# Patient Record
Sex: Female | Born: 1970 | Race: Black or African American | Hispanic: No | Marital: Single | Smoking: Former smoker
Health system: Southern US, Community
[De-identification: ages and names within clinical notes are randomized; demographics above are authoritative.]

## PROBLEM LIST (undated history)

## (undated) DIAGNOSIS — F319 Bipolar disorder, unspecified: Secondary | ICD-10-CM

## (undated) DIAGNOSIS — I82409 Acute embolism and thrombosis of unspecified deep veins of unspecified lower extremity: Secondary | ICD-10-CM

## (undated) DIAGNOSIS — E119 Type 2 diabetes mellitus without complications: Secondary | ICD-10-CM

## (undated) HISTORY — PX: LEG SURGERY: SHX1003

---

## 2019-01-05 ENCOUNTER — Other Ambulatory Visit: Payer: Self-pay

## 2019-01-05 ENCOUNTER — Emergency Department (HOSPITAL_BASED_OUTPATIENT_CLINIC_OR_DEPARTMENT_OTHER): Payer: Medicare Other

## 2019-01-05 ENCOUNTER — Emergency Department (HOSPITAL_BASED_OUTPATIENT_CLINIC_OR_DEPARTMENT_OTHER)
Admission: EM | Admit: 2019-01-05 | Discharge: 2019-01-05 | Discharge status: Against Medical Advice | Payer: Medicare Other | Attending: Emergency Medicine | Admitting: Emergency Medicine

## 2019-01-05 ENCOUNTER — Encounter (HOSPITAL_BASED_OUTPATIENT_CLINIC_OR_DEPARTMENT_OTHER): Payer: Self-pay

## 2019-01-05 DIAGNOSIS — Z5329 Procedure and treatment not carried out because of patient's decision for other reasons: Secondary | ICD-10-CM | POA: Diagnosis not present

## 2019-01-05 DIAGNOSIS — Z87891 Personal history of nicotine dependence: Secondary | ICD-10-CM | POA: Diagnosis not present

## 2019-01-05 DIAGNOSIS — M25512 Pain in left shoulder: Secondary | ICD-10-CM | POA: Insufficient documentation

## 2019-01-05 DIAGNOSIS — E119 Type 2 diabetes mellitus without complications: Secondary | ICD-10-CM | POA: Insufficient documentation

## 2019-01-05 DIAGNOSIS — M25511 Pain in right shoulder: Secondary | ICD-10-CM

## 2019-01-05 DIAGNOSIS — M545 Low back pain, unspecified: Secondary | ICD-10-CM

## 2019-01-05 HISTORY — DX: Bipolar disorder, unspecified: F31.9

## 2019-01-05 HISTORY — DX: Acute embolism and thrombosis of unspecified deep veins of unspecified lower extremity: I82.409

## 2019-01-05 HISTORY — DX: Type 2 diabetes mellitus without complications: E11.9
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
## 2019-01-28 ENCOUNTER — Encounter (HOSPITAL_COMMUNITY): Payer: Self-pay | Admitting: *Deleted

## 2019-01-28 ENCOUNTER — Other Ambulatory Visit: Payer: Self-pay

## 2019-01-28 ENCOUNTER — Emergency Department (HOSPITAL_COMMUNITY): Payer: Medicare Other

## 2019-01-28 ENCOUNTER — Emergency Department (HOSPITAL_COMMUNITY)
Admission: EM | Admit: 2019-01-28 | Discharge: 2019-01-28 | Disposition: A | Discharge status: Home / Self Care | Payer: Medicare Other | Attending: Emergency Medicine | Admitting: Emergency Medicine

## 2019-01-28 DIAGNOSIS — M25561 Pain in right knee: Secondary | ICD-10-CM | POA: Insufficient documentation

## 2019-01-28 DIAGNOSIS — Z87891 Personal history of nicotine dependence: Secondary | ICD-10-CM | POA: Insufficient documentation

## 2019-01-28 DIAGNOSIS — Z86718 Personal history of other venous thrombosis and embolism: Secondary | ICD-10-CM | POA: Diagnosis not present

## 2019-01-28 DIAGNOSIS — M79604 Pain in right leg: Secondary | ICD-10-CM

## 2019-01-28 DIAGNOSIS — E119 Type 2 diabetes mellitus without complications: Secondary | ICD-10-CM | POA: Insufficient documentation

## 2019-01-28 MED ORDER — IBUPROFEN 200 MG PO TABS
600.0000 mg | ORAL_TABLET | Freq: Once | ORAL | Status: AC
  Administered 2019-01-28: 600 mg via ORAL
  Filled 2019-01-28: qty 3

## 2019-01-28 NOTE — ED Notes (Signed)
Patient transported to X-ray 
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
## 2019-01-28 NOTE — ED Triage Notes (Signed)
EMS reports pt fell earlier today (trip and fall) coming home from swimming pool hurting rt leg. Since fall she has been walking, reported by EMS. 90/60-70-18-99% CBG 120

## 2019-01-28 NOTE — ED Notes (Signed)
Bed: WA17 Expected date:  Expected time:  Means of arrival:  Comments: 48 yo fall- leg and hip pain

## 2019-07-16 IMAGING — CR RIGHT FOOT COMPLETE - 3+ VIEW
3 series · 3 of 3 positions shown · non-contrast
Comparison: None.

CLINICAL DATA: Fall, pain

EXAM:
RIGHT TIBIA AND FIBULA - 2 VIEW; RIGHT FOOT COMPLETE - 3+ VIEW

[x foot ap right]
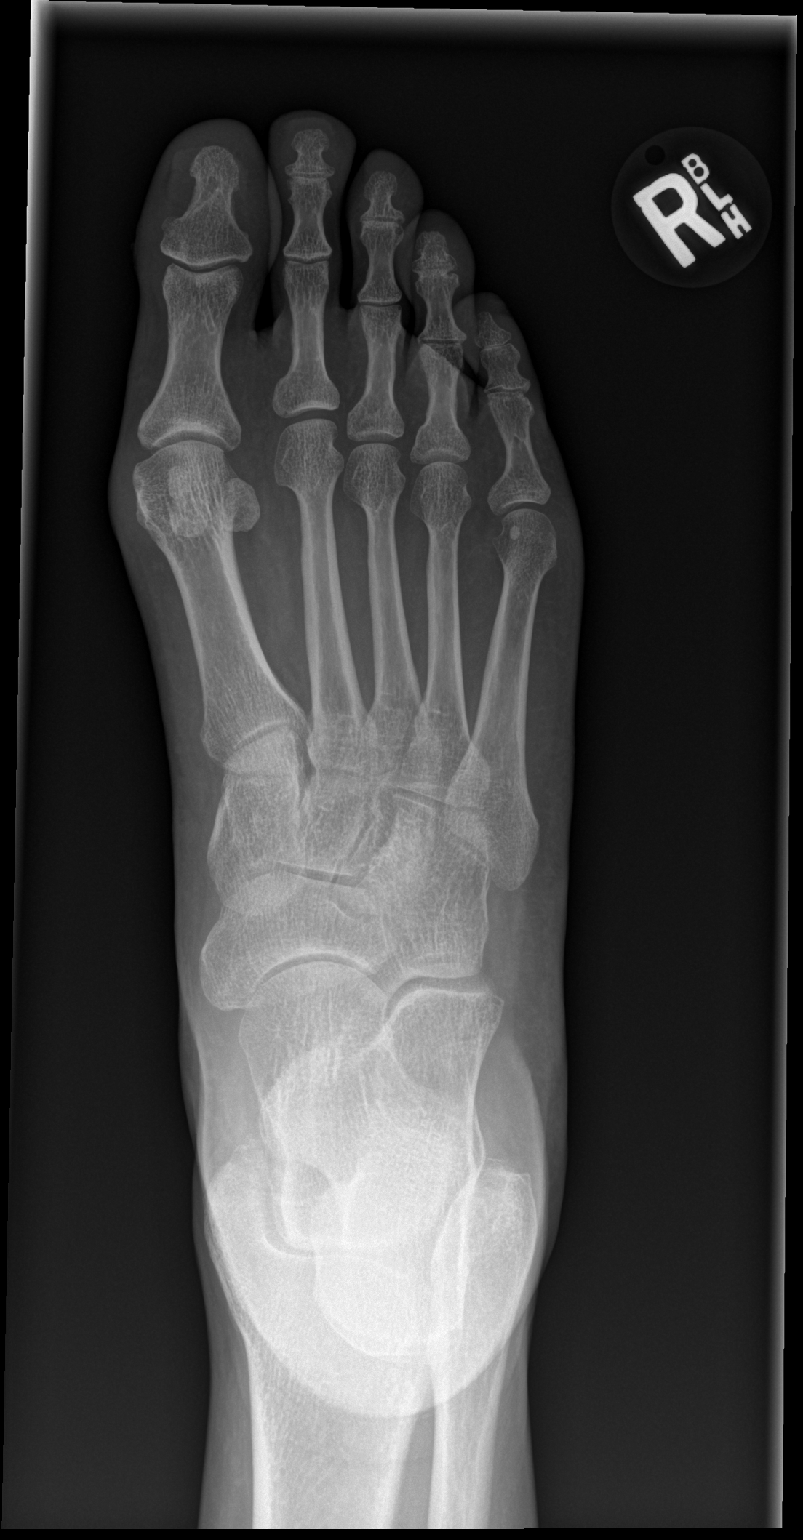

[x foot obl right]
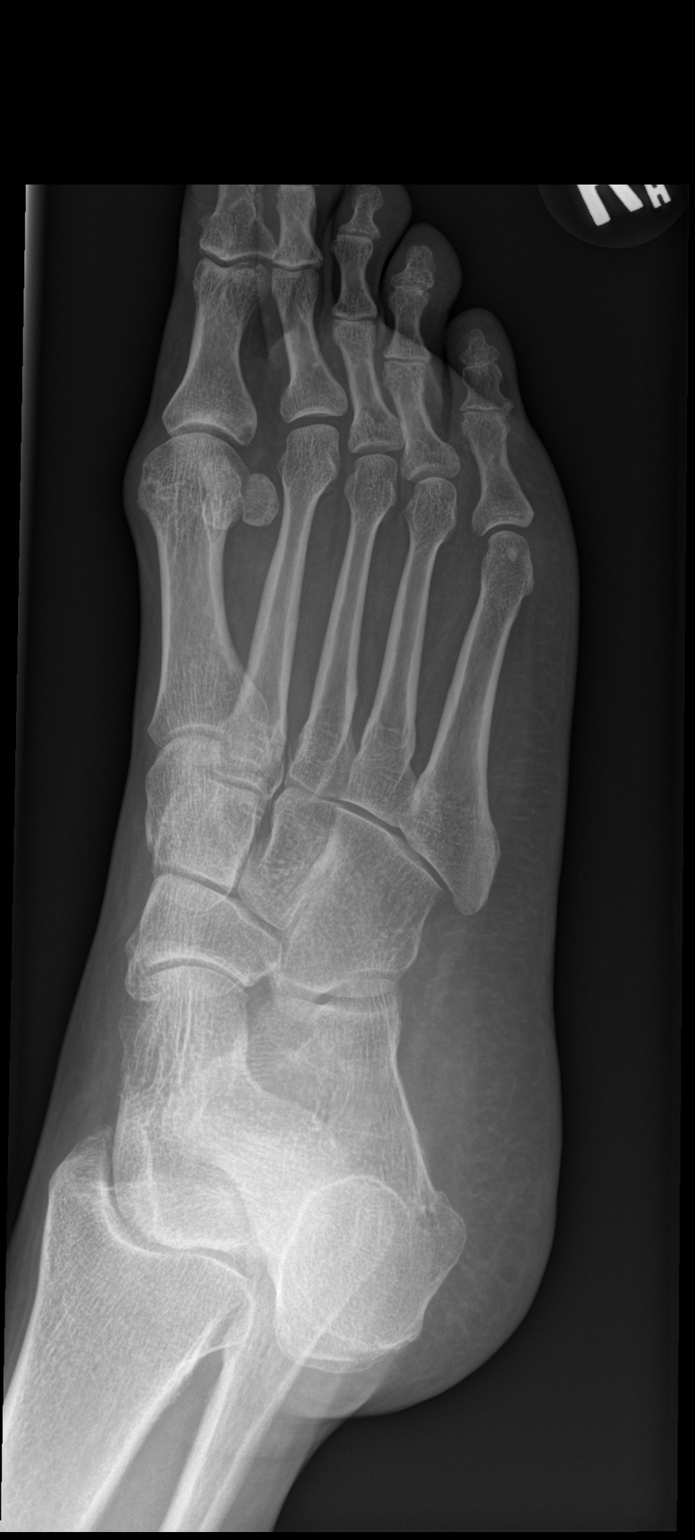

[x foot lat right]
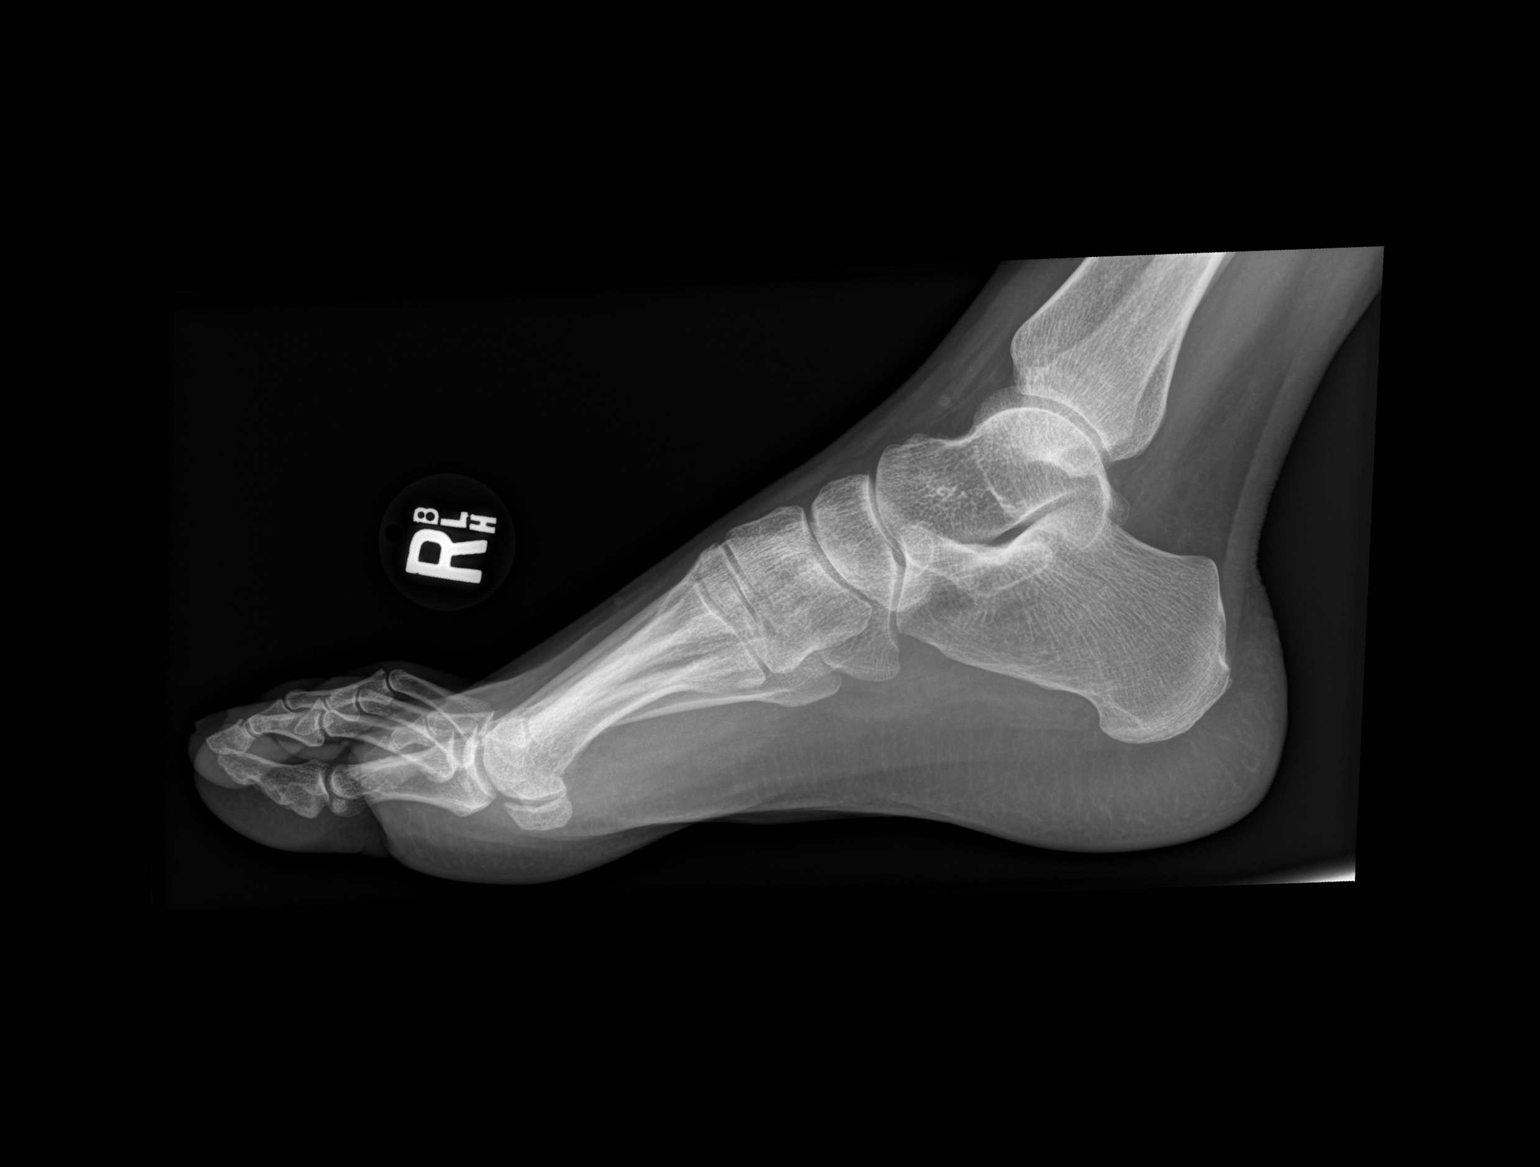

[3 of 3 positions shown; findings below may reference images not displayed]

FINDINGS: There is no evidence of fracture or other focal bone lesions. Soft
tissues are unremarkable.
IMPRESSION: No fracture or dislocation of the right foot or right tibia or
fibula.

## 2019-07-16 IMAGING — CR RIGHT TIBIA AND FIBULA - 2 VIEW
3 series · 3 of 3 positions shown · non-contrast
Comparison: None.

CLINICAL DATA: Fall, pain

EXAM:
RIGHT TIBIA AND FIBULA - 2 VIEW; RIGHT FOOT COMPLETE - 3+ VIEW

[x tib-fib ap right]
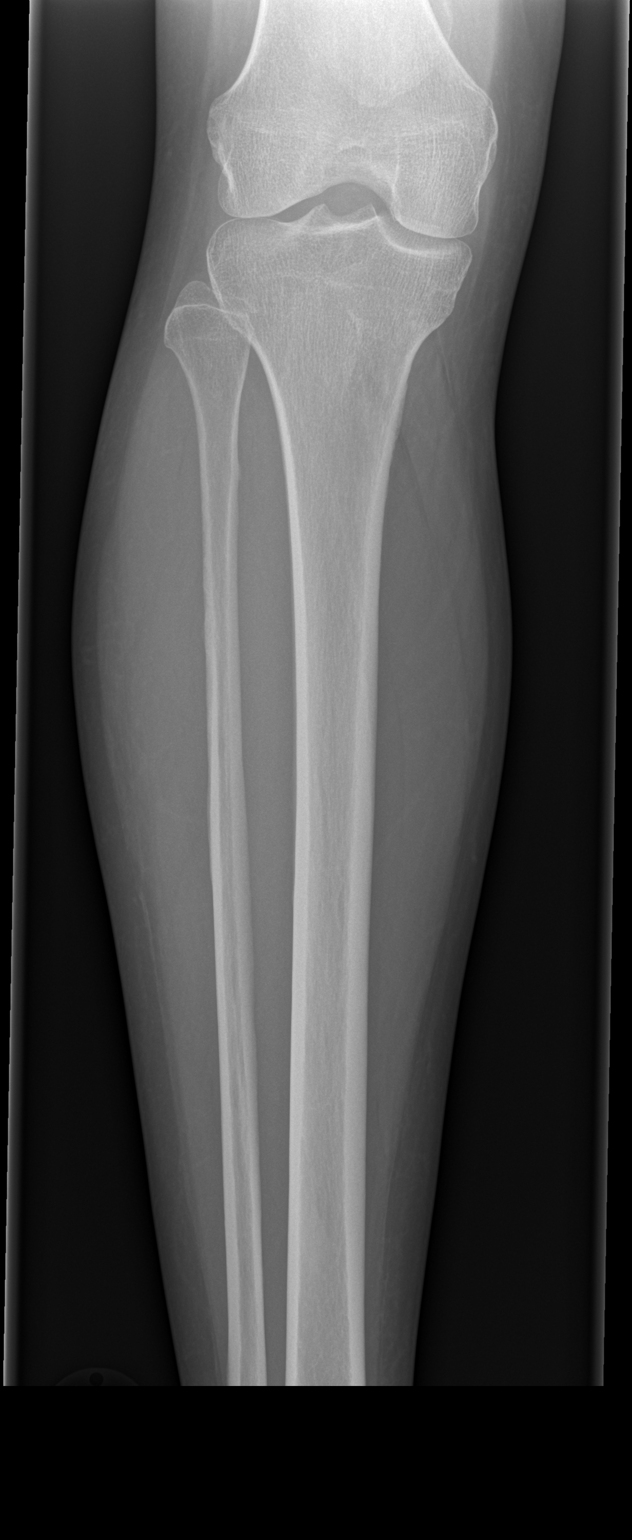

[x tib-fib lat right (1 of 2)]
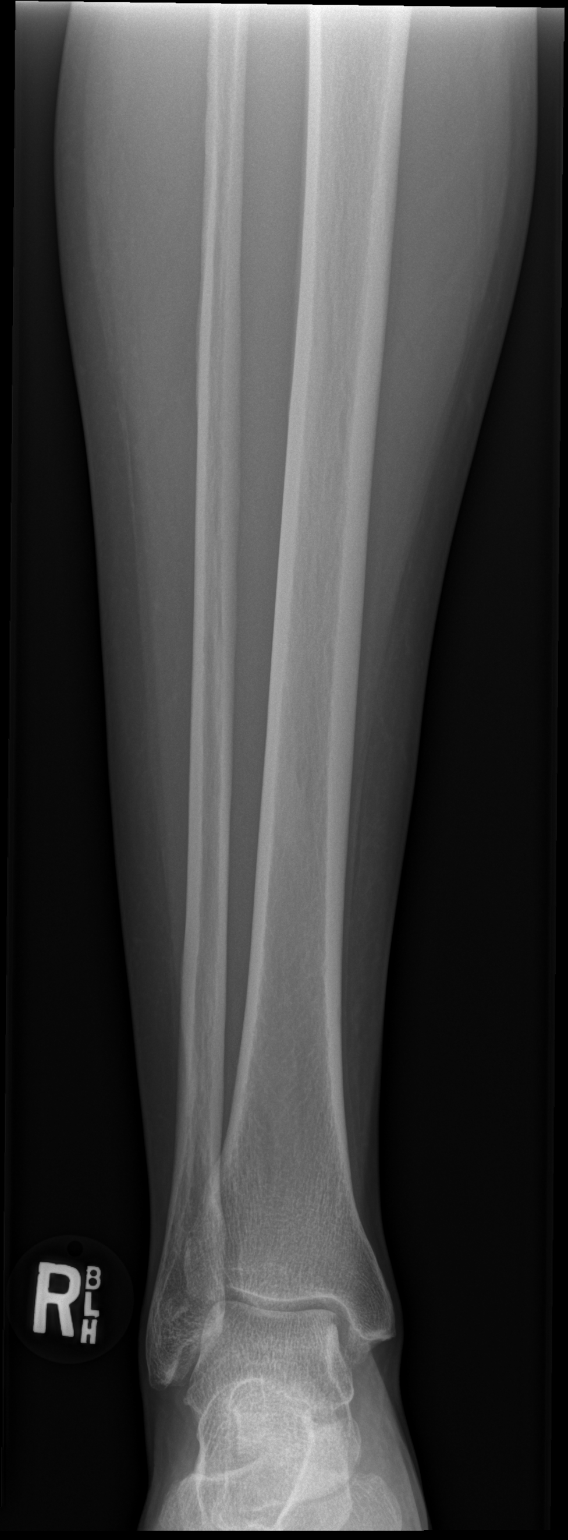

[x tib-fib lat right (2 of 2)]
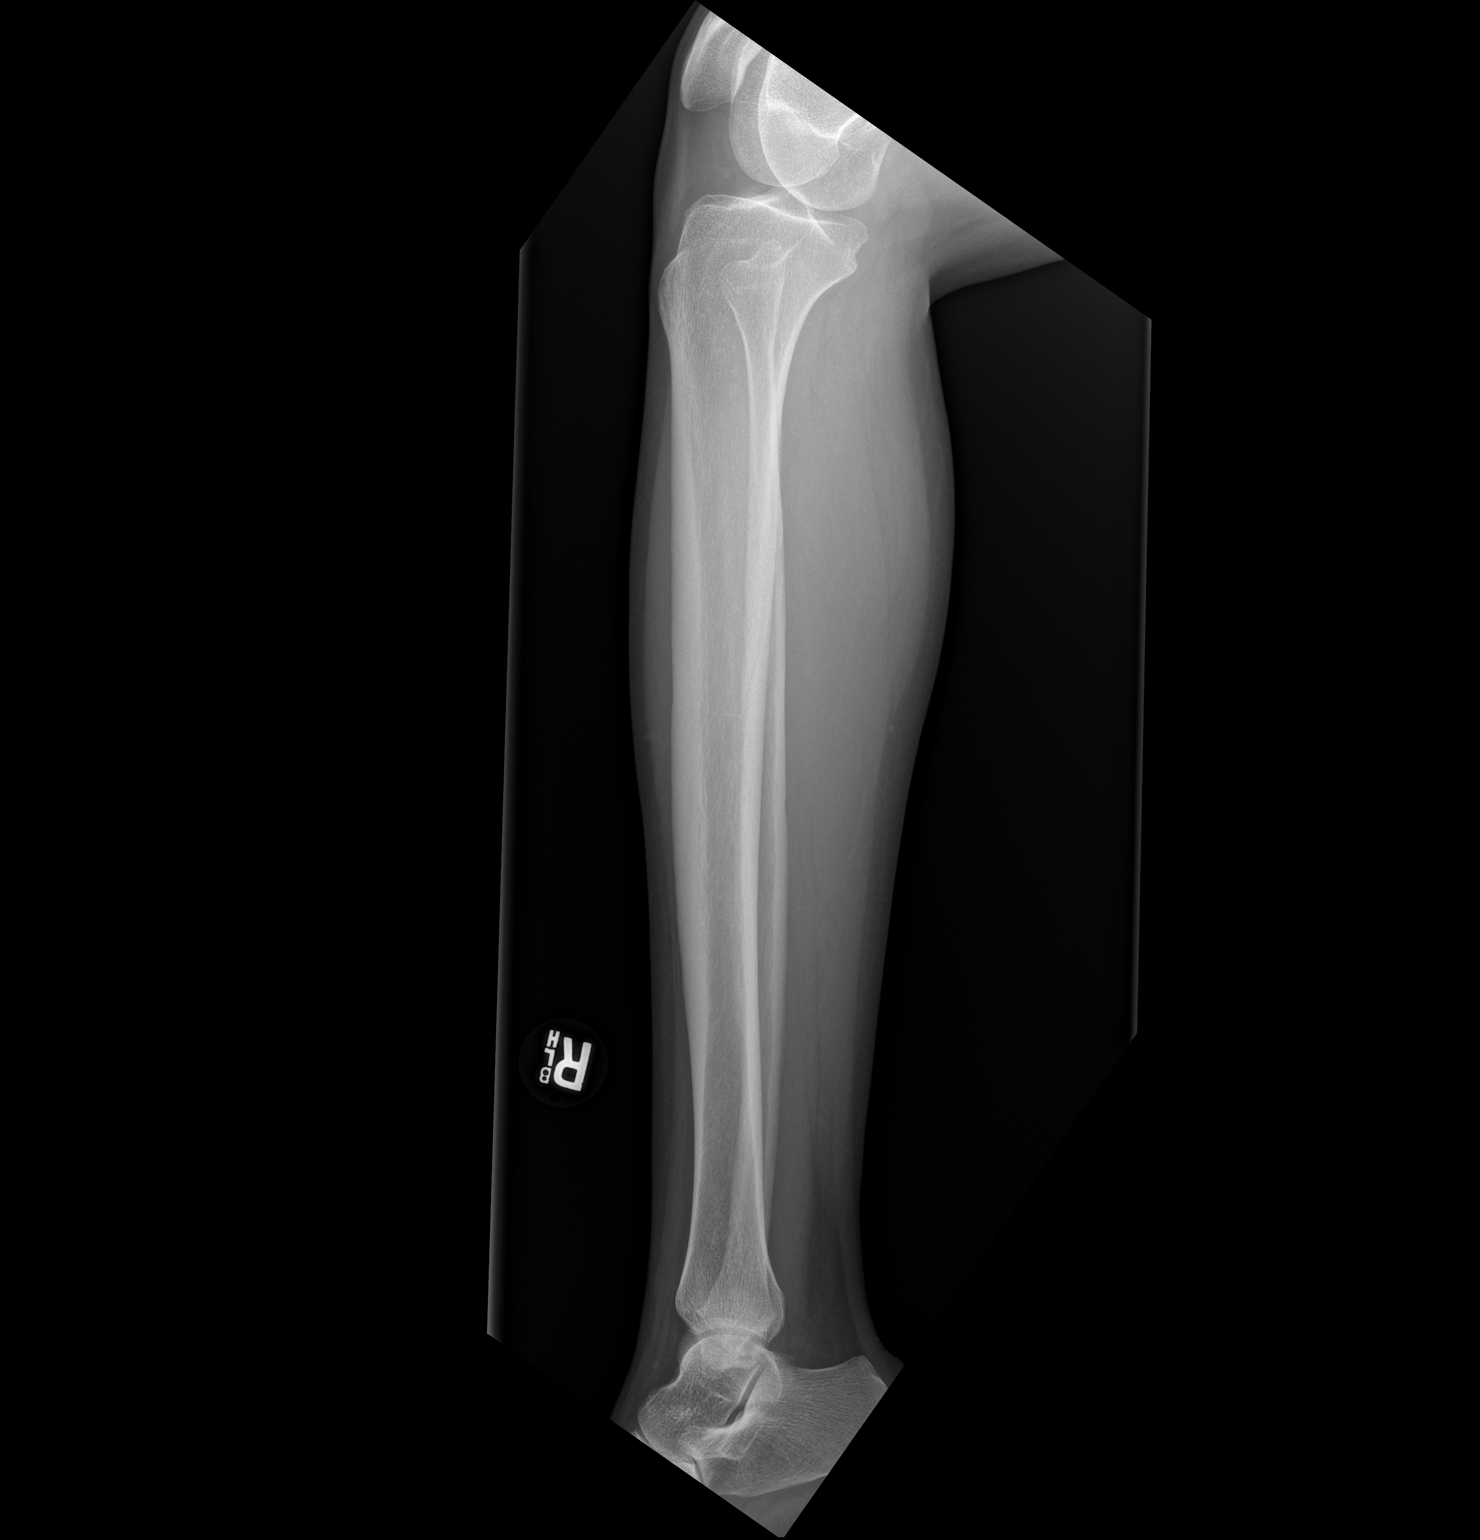

[3 of 3 positions shown; findings below may reference images not displayed]

FINDINGS: There is no evidence of fracture or other focal bone lesions. Soft
tissues are unremarkable.
IMPRESSION: No fracture or dislocation of the right foot or right tibia or
fibula.

## 2021-08-27 NOTE — Progress Notes (Signed)
Formatting of this note might be different from the original.  Left message to schedule AWV  Electronically signed by Deeann Dowse, MA at 08/27/2021  2:41 PM EST

## 2021-11-21 NOTE — Progress Notes (Signed)
Formatting of this note is different from the original.     11/21/21 1319   Therapeutic Recreation   Source of Information Patient;Chart   Reason for Hospitalization per the Patient. Pt stated "Suicidal."   Patient's ability to answer questions Independently;with prompts, assistance   Patient admitted from Support Person's Home   Care Facility Name Pt reported she is living with her aunt.   Transportation Dependent on Others   Current City/County Living In [place]   Work/School Disabled   Highest Level Education High School Graduate   Physical   Is the patient able to complete ADL's and household tasks independently? Yes   Vision - Right Eye Glasses - to see close up;Glasses - to see distances   Vision - Left  Eye Glasses - to see close up;Glasses - to see distances   Hearing - Right Ear Functional   Hearing - Left Ear Functional   Additional Equipment/Devices used at home None   Is Pain an ongoing issue for the patient? Yes   Is the Patient in pain now?   (N/A)   Pain Score   (N/A)   Pain Location   (N/A)   Multiple Pain Sites   (N/A)   Social Leisure   Does the patient have a positive support system? Yes   Who is the Support? "My dad."   How many times does the patient get out of the house a week? "Not a lot."   Does the patient have any knowledge of community resources? Yes   Resources Support Groups;Library;Parks and Rec;[employer];Church   The Patient is Comfortable in: Alone;Small Groups   Has the patient's daily routine changed since not feeling well? No   Does the patient have any cultural beliefs/activites that would impact care? No   How does the patient spend Cognitive leisure / free time? Reading   How does the patient spend Passive leisure / free time? TV   How does the patient spend Active leisure / free time? Walking   How does the patient spend Creative leisure / free time? Crafts   How does the patient spend Social leisure / free time? Friend/family gatherings   Does the patient prefer to spend  leisure/free time Both   Has the patient been active in leisure activities No   If no, how long has it been? "A long time."   Barriers to participating in leisure activities? Finances;Motivation;Physical limitations;Pain;Transportation;Companionship   Describe Barriers: Pt identified lack of finances, low motivation, physical limitations, chronic pain, lack of transportation, and lack of companionship as barriers.   Does the patient have any coping skills? Yes   Healthy coping skills Talk to people   Unhealthy coping skills Using drugs   Cognitive   Does the patient have issues  or difficulty with memory? No   Orientation To: Person (Yes);Place (Yes);Situation (Yes);Date (No)   Hallucinations? No   Delusions? No   Paranoia Evidenced by: N/A   Does the patient have difficulty with time management skills? Yes   Does the patient have difficulty with their ability to concentrate or focus? Yes   Does the patient have difficulty with problem solving skills? Yes   Emotional   Is there concern about patient?s self esteem? Yes   Describe Self Esteem Concern: Pt described self-esteem as "bad".   Is there concern about patient?s motivation/energy level? Yes   Describe Motivation/Energy Level  Concern: Pt described both motivation and energy as "down".   Does the patient have life stresses? Yes  Life Stresses Identified Other:  (Father's health; Unresolved grief of late daughter; Unstable housing)   Is there concern about patient?s anger management? No   Is the patient comfortable talking about his/her feelings and concerns? Yes   History of Abuse? No   Describe History of Abuse N/A      11/21/21 1325   Substance Abuse Questions   Does the patient abuse Drugs and Alcohol? Yes   At admission  BAL UDS (per Chart) UDS  (BAL: Normal; UDS: Fentanyl)   Is there concern that drinking/using is affecting the patient?s health? Yes   Describe the Concern "It could kill me."   Has the patient ever tried to stop drinking/using before? Yes    How long was patient clean/sober? "A few years."   What helped? "I can't remember."   What caused Relapse? "I can't remember."   Has drinking/using ever created problems for the patient: Yes   Between patient and family? Yes   Between patient and friends? Yes   At work/School? No   Explain drinking problem Pt reported she snorts approximately $10 worth of cocaine over 2-3 days and snorts approximately $10 worth of fentanyl over 2 days.   Does the patient have any leisure interests that can be enjoyed clean/sober? Yes   Leisure interests enjoyed. "Watching tv."   Does the patient have anyone they enjoy spending time with clean/sober? Yes   Who does patient enjoy spending time with? "My daddy, my aunt."   Age patient began drinking? N/A   Age patient began Using Drugs? Pt was unable to recall.   Patient motivation to begin recovery (Per Patient) (1-10) 10   Has the patient ever attended support group meetings? No      11/21/21 1327   Patient Perspective   What else should we know about you in order to help you best while you are here? "I don't know."   How do you feel staff can help you most? "Get me on my right medicines."   What would you like to change about your daily routine? "Just stay on my medicines."   Therapist Summary   Patient?s behaviors impacting Therapy: Other  (Withdrawal sx.)   Patient?s Strengths: Physical abilities;Cognitive abilities;Support system;Community resources   Patient?s Weaknesses: Communication;Poor judgement;Self esteem/image;Coping skills;Transportation;Motivation;Leisure involvement;Leisure skills   Suggested Interventions: Exercise;Relaxation techniques;Social skills development;Leisure skill development;Leisure education;Community awareness;Pet Therapy;Cognitive stimulation;Music;Self awareness   Patient Education Anger Management;Stress management;Self esteem;Coping skills;Time management;Wellness education;Grief and Loss;Goal setting   Goals for RT treatment during this  hospitalization: LTG 1.0: Pt will attend 1 scheduled recreational therapy group daily upon admission to promote knowledge of healthy coping skills.   Rec Therapist Comments: Pt presented as orientedx3, as she was unable to identify the correct date. Pt presented as lethargic, as she was resting on her bed while having difficulty staying awake while answering questions. Pt displayed a depressed mood and tearful affect. Pt's thoughts appeared clear while speech was limited yet relevant in content while quiet in volume and sad in tone. When asked reason for admission, pt stated "Suicidal." Per chart, pt presented to the ED d/t body aches. During evaluation with ED provider, pt endorsed suicidal ideations. At the time of this assessment, pt continued to endorse suicidal ideations but denied having a plan. Pt denied homicidal ideations, hallucinations, delusions, and paranoia at any time. Pt denied ETOH use but endorsed drug use. Pt's BAL was normal while UDS was positive for cocaine and fentanyl. Pt reported she snorts approximately $10 worth  of cocaine over 2-3 days and snorts approximately $10 worth of fentanyl over 2 days. Pt expressed concerns with usage and rated her motivation to stop using to be 10/10 at this time. Pt reported stress related to her father's health, chronic pain, current living situation, and unresolved grief of late daughter. Pt was able to identify few healthy leisure activities of interest and only with heavy probing. Pt reported minimal involvement in leisure activities for "a long time" d/t lack of finances, low motivation, physical limitations, chronic pain, lack of transportation, and lack of companionship.   Lessie Dings, LRT CTRS  11/21/2021 / 1:44 PM      Electronically signed by Lessie Dings, LRT CTRS at 11/21/2021  1:44 PM EST

## 2021-11-21 NOTE — Care Plan (Signed)
Formatting of this note might be different from the original.    Problem: Coping - Ineffective, Patient/Family  Goal: Effective coping  Outcome: Progressing    Problem: Discharge Planning  Goal: Knowledge of treatment plan (Why is it important for me to do this?)  Outcome: Progressing  Goal: Knowledge of medication management  Outcome: Progressing    Problem: Pain - Acute  Goal: Reduced pain sensation  Outcome: Progressing    Problem: Physical Injury, Risk of - to Self or Others  Goal: Absence of self-harm  Outcome: Progressing  Goal: Decrease in suicidal ideations  Outcome: Progressing    Patient has been calm and cooperative thus far, however, has been isolative to her room and presents as withdrawn, currently denies SI/HI/AVH but endorses overwhelming depressive symptoms, no other questions or concerns discussed during shift  Electronically signed by Sabino Donovan, RN, BSN at 11/21/2021 10:21 PM EST

## 2021-11-21 NOTE — Nursing Note (Signed)
Formatting of this note might be different from the original.  Pt up to unit at 1318. Contraband search and skin search assisted by Lora Havens, MHT. No contraband. No current injuries. A healed laceration to R leg calf that is not draining, pt states it is years old that is just how it looks. States using fentanyl "a small amount" on the street. States no other drug or nicotine use. States is here for SI. States having fleeting thoughts of SI but that if pt comes up with a plan or suicide intent would let staff know. Requested something to eat which was provided by psych tech.   Electronically signed by Hillery Jacks, RN at 11/21/2021  1:24 PM EST

## 2021-11-21 NOTE — Progress Notes (Signed)
Formatting of this note is different from the original.     11/21/21 1430   BH DID NOT ATTEND GROUP   Attendance Status Did Not Attend   Did not Attend Group Type Rec Therapy-Wellness   Reason for Not Attending Group Patient Refused  (Pt was found laying in bed at the time of the group invitation. Pt stated "okay", when encouraged to attend group session. Pt did not present to group.)   Follow-up for Not Attending Group Therapy staff will continue to encourage pt to attend group sessions.     Kerry Dory, LRT CTRS  11/21/2021 / 3:27 PM      Electronically signed by Kerry Dory, LRT CTRS at 11/21/2021  3:27 PM EST

## 2021-11-22 NOTE — H&P (Signed)
Formatting of this note is different from the original.  Images from the original note were not included.  [facility]  [facility] - H&P      Date of Admission: 11/21/2021  Attending Provider:  Burton Apley, MD  Code Status:  Full Code  History Source:  patient, review of medical records  Record Review: moderate  Assessment     Hospital Diagnoses:  Principal Problem:    Bipolar 1 disorder (*)  Active Problems:    Type 2 diabetes mellitus, without long-term current use of insulin (*)    Moderate cocaine use disorder (*)    PTSD (post-traumatic stress disorder)    Persistent complex bereavement disorder    Reason(s) for Admission: suicidal ideation and unable to contract for safety outside of hospital and patient manifests major disability in social, interpersonal, occupational and/or educations functioning which threatens survival and can only be addressed in a hospital setting    The patient does not have a legal guardian or HCPOA.    Formulation and MDM: *a 51 year old African American female with a history of schizophrenia, bipolar disorder, depression, and polysubstance use transferred from [facility] ED for SI in-context of Prior trauma    Treatment Plan     - Precautions:   - SI, Aggression and/ or Withdrawal  - Commitment status:  Voluntary  - Goals and Interventions:   - Group and milieu therapy with family/outpatient support meeting as tolerated. Treatment plan focuses on modifiable risk factors and includes: level of care coordination and recommendations, risks/benefits/alternatives to treatment including common side effects and black box warnings on medication, risk factor reduction, safety goals/plan, importance of compliance with chosen treatment and firearms access reduction plan.              - Other issues to be addressed:  safety planning, housing, aftercare, addiction, medication compliance, ability to attend scheduled appointments, titration of medication with  maximized efficacy, consideration of adjunct medications and/or ECT.                - Treatment goals will address insight and acceptance of mental illness, coping skills, communication skills, mood, anxiety, sleep and appetite, suicidal or homicidal ideation, psychosis, self-injurious behaviors, aggression, improving independence, improving social relationships, domestic violence, and other issues as appropriate.    - Medications:  ? divalproex sodium  250 mg Oral Daily   ? FLUoxetine  20 mg Oral Daily   ? insulin lispro protamine-insulin lispro  4 Units Subcutaneous BID MEALS   ? metFORMIN  500 mg Oral Daily with breakfast   ? nitrofurantoin (macrocrystal-monohydrate)  100 mg Oral BID MEALS   ? THERA  1 tablet Oral Daily     - Pertinent Labs:              - Reviewed.  All labs obtained and reviewed from the current EMR, unless otherwise stated.              - Please see labs as reported below  - Consults:   - none  - Estimated duration of hospitalization:   -  5 to 7 days   -  I certify that the patient does need, on a daily basis, active treatment furnished directly by or requiring the supervision of inpatient psychiatric facility personnel.  For inpatient care, physician will direct treatment team plan within three days of admission and at least weekly thereafter.     CC & HPI  CC: "I was not thinking right".     During the assessment, the pt reported that she was unable to control her anxiety, feeling overwhelmed, no one is around her, thinking about her past - her mother and daughter who died 4 yrs ago, her father has open heart surgery recently, unable to secure any job, not eating well, lost 6 lbs in last month and thinking about walking to the traffic. Pt has been hearing voice of a gun shot from how her daughter was died, unable to sleep at night. Denied any AVH or perceptional disturbances.     Per ED note,   the patient lying in bed in ED in darkened room with eyes closed in NAD. Patient arouses when  name called. Patient is oriented x4 when awake. Patient has difficulty staying awake during assessment and has to be woke up several times to answer questions. Patient is cooperative and answers questions appropriately. Patient endorse SI with a plan to get a gun and shoot herself. Patient denies having a gun stating it is now at her cousin's house. Patient also endorses AVH, but is unable to describe what she sees or hears with hallucinations, but states they have increased in frequency over the last couple of weeks.     Per [facility] access note, "Pt is a 51 year old African American female with a history of schizophrenia, bipolar disorder, depression, and polysubstance use who presents to [facility] ED via personal transport due to experiencing body aches. During re-eval with ED provider, pt shared feeling suicidal with a plan to walk out into traffic, so a [facility] consult was placed. Upon arrival to complete assessment, pt is NAD, lying on back resting. Pt was easily awaken by voice prompt and was oriented times four. Pt presents as tearful and depressed with a blunted affect. Pt shares suicidal thoughts started shortly after feeling aches and pains in body. Pt became tearful as she described having intermittent suicidal thoughts since the death of her daughter, which occurred 4 years ago. Pt shares experiencing loss of pleasure, social isolation, and decrease sleep in the past 2 years. Pt endorses previous suicidal attempt of driving scooter into traffic. EMR review shares a previous attempt of intentionally overdosing on Xanax. Pt denies HI/SIB and psychosis. Pt endorses using cocaine (powder) and fentanyl, route is snorting. Pt shares last use was last week but UDS is positive for both cocaine and fentanyl. Pt shares "I use every now and then, I don't do much" Pt shares on average spending $20 a piece on both cocaine and fentanyl. Pt shares being prescribe anti psychotic medications by Dr. Sharmon Revere but pt is a  poor historian and does not know the names of medications that were prescribed by provider. Pt   endorses being medication compliant. Pt has had numerous [facility] hospitalizations between [facility] and [facility]. Pt's sister Marcelino Duster [phone] contacted for collateral information. Per Ms. Bitting, "I haven't seen her in a month..... then she was doing a lot of drugs and being suicidal..... she has been like this since our mom died.... it got worse after her daughter died". Ms. Renato Gails shares "she needs a facility far away from here to get away from those friends of hers.... she's burned bridges.... she's hopping from house to house". Ms. Renato Gails describes pt's baseline as "quiet, humble, fun to be around, and sweet as gold".    Current suicidal/homicidal ideations:  Suicidal with plan and Cannot contract for safety outside of hospital  Current auditory/visual hallucinations:  Denies hallucinations and does not appear to be responding to internal stimuli  Current delusions: Denies  Current paranoia:  Denies  Current withdrawal symptoms necessitating inpatient detox:  COWS score: 3  Sleep:  difficulty falling asleep and difficulty maintaining sleep  Appetite: Restricting  Hygiene:  clean  History of falls in the last 12 months:  No  History of head injury:  No    Physical Exam     Physical exam:   HEENT:  WNL, PEERLA, EOMs intact, TMs clear, no lymphadenopathy  Heart:  RRR, no MRG  Lungs: CTA  Abd:  Soft, nontender, nondistended  Neuro:  2+ pulses, 2+ reflexes, no sensory loss    Past Psychiatric History   All pertinent past Psychiatric history reviewed and updated as per requirement.     Previous diagnoses: Bipolar 1, schizophrenia, Depression  Previous psychiatric medication trials: Abilify, Cymbalta, Wellbutrin, Lamictal, Seroquel, Zoloft, Paxil, Haldol, Xanax, Valium, Depakote  Past suicidal/homicidal ideation/attempt: Overdose on Xanax 2 years ago, per EHR  Current/Past psychiatric provider: Dr  Chesley Mires  Previous psychiatric hospitalizations/Rehab: numerous BH hospitalizations at [facility] and [facility]    Past Medical History   All past Medical history reviewed and updated as per requirement.     Past Medical History:   Diagnosis Date   ? Anxiety    ? Asthma    ? Bipolar 1 disorder (*)    ? Depression    ? Diabetes mellitus (*)    ? Diabetes mellitus, type 2 (*)    ? DVT (deep venous thrombosis) (*)     right leg   ? Failure of the liver caused by chronic liver disease (*) 03/13/2013   ? Hepatitis C infection    ? 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 Liver failure (*)     hx of   ? Neuropathy     pt denies Diabetes   ? Protein C deficiency (*)    ? Schizoaffective disorder (*) 08/10/2019     Substance Use History (Over the past 12 months)   Marijuana: Denies  Cocaine: Endorses last use one week ago, however (UDS +)  Opiates: Endorses fentanyl use (UDS +)  Stimulants: Denies  Benzodiazepine: Denies  Tobacco:  Denies  Alcohol: Denies  Other illicit drug usage: Denies      The patient was counseled on the dangers of alcohol/substance use and practical counseling included: Concern for unhealthy drinking, alcohol linked to health risk such as HTN, DM, hepatic failure, and/or stroke, and advised to abstain    See [facility] staff evaluations & assessments for further details.  Findings to be discussed by team and integrated into treatment plan as indicated.  Tobacco Use Screening and Recommendation     Tobacco use 30 days prior to admission?  Patient denied    The patient was not counseled on the dangers of tobacco use and practical counseling included: N/A.  Reviewed strategies to maximize success, including stress management and support of family/friends.    FDA-approved cessation medication offered/received: N/A    Social and Family History   All pertinent Social and family history was reviewed and updated as requirement.     Social History     Socioeconomic History   ? Marital status:  Single     Spouse name: Not on file   ? Number of children: Not on file   ? Years of education: Not on file   ? Highest education level:  Not on file   Occupational History   ? Not on file   Tobacco Use   ? Smoking status: Some Days     Packs/day: 0.50     Types: Cigarettes     Last attempt to quit: 05/08/2019     Years since quitting: 2.5   ? Smokeless tobacco: Former   Advertising account planner   ? Vaping Use: Some days   Substance and Sexual Activity   ? Alcohol use: No     Alcohol/week: 0.0 standard drinks   ? Drug use: Not Currently     Frequency: 1.0 times per week     Types: Marijuana, "Crack" cocaine     Comment: last use 7 months ago per pt   ? Sexual activity: Not Currently   Other Topics Concern   ? Not on file   Social History Narrative   ? Not on file     Social Determinants of Health     Financial Resource Strain: Medium Risk   ? Difficulty of Paying Living Expenses: Somewhat hard   Food Insecurity: Not on file   Transportation Needs: No Transportation Needs   ? Lack of Transportation (Medical): No   ? Lack of Transportation (Non-Medical): No   Physical Activity: Not on file   Stress: Stress Concern Present   ? Feeling of Stress : Very much   Social Connections: Not on file   Intimate Partner Violence: Not on file   Housing Stability: Not on file     Family History   Problem Relation Age of Onset   ? Diabetes Mother    ? Arthritis Mother    ? Depression Mother    ? Vision loss Mother    ? Heart disease Father    ? Arthritis Father    ? Depression Father    ? Hyperlipidemia Father    ? Hypertension Father    ? Cancer Sister    ? Post-traumatic stress disorder Maternal Uncle      Evaluation     Medications:  ? divalproex sodium  250 mg Oral Daily   ? FLUoxetine  20 mg Oral Daily   ? insulin lispro protamine-insulin lispro  4 Units Subcutaneous BID MEALS   ? metFORMIN  500 mg Oral Daily with breakfast   ? nitrofurantoin (macrocrystal-monohydrate)  100 mg Oral BID MEALS   ? THERA  1 tablet Oral Daily     aluminum &  magnesium hydroxide-simethicone, diphenhydrAMINE **OR** diphenhydrAMINE, ibuprofen, LORAzepam, OLANZapine zydis **OR** OLANZapine, ondansetron **OR** ondansetron, polyethylene glycol, traZODone **AND** traZODone  Allergies:  Allergies   Allergen Reactions   ? Norflex Itching   ? Tramadol Nausea And Vomiting, Swelling and Rash     Tongue swelling in 2009.  Tongue swelling in 2009.  Other reaction(s): Swelling (ALLERGY/intolerance)  Tongue swelling in 2009.  Tongue swelling   ? Abilify Other     Dystonic reaction   ? Acetaminophen Other and Rash     Pt states he takes this OTC  Liver failure   ? Cymbalta [Duloxetine Hcl] Nausea And Vomiting, Rash and Dermatitis   ? Dabigatran Etexilate Mesylate Unknown   ? Haloperidol Other     stiffness   ? Lyrica Unknown     Suicidal thoughts   ? Paroxetine Hcl Dermatitis   ? Pregabalin Other     Suicidal thoughts   ? Quetiapine Fumarate Rash   ? Sertraline Rash     In mouth    ? Toradol [Ketorolac Tromethamine] Hives   ?  Vicodin [Hydrocodone-Acetaminophen] Rash   ? Wellbutrin [Bupropion] Nausea And Vomiting     Other reaction(s): Vomiting (intolerance)   ? Oxycodone-Acetaminophen Nausea Only     Pt reports she is not allergic to Oxy IR just the tylenol    ? Tylox Nausea Only     Vitals:   Vitals:    11/22/21 0550   BP: 116/70   Pulse: 64   Resp: 18   Temp: 97.9 F (36.6 C)   SpO2: 100%       Labs were reviewed and included:  Lab Results   Component Value Date    WBC 7.9 11/21/2021    HGB 9.0 (L) 11/21/2021    HCT 29.6 (L) 11/21/2021    Plt Ct 353 11/21/2021    CHOLESTEROL TOTAL 184 06/12/2020    Trig 99 06/12/2020    HDL 75 06/12/2020    LDL 89 06/12/2020    ALT 20 11/21/2021    AST 18 11/21/2021    Na 138 11/21/2021    Potassium 3.7 11/21/2021    Cl 103 11/21/2021    Creatinine 0.50 (L) 11/21/2021    BUN 8 11/21/2021    CO2 27 11/21/2021    Phosphorus 3.4 07/24/2009    TSH 0.60 06/12/2020    INR 1.0 06/02/2021    Glucose, POC 86 11/22/2021    Hemoglobin A1c 5.5 07/24/2021     Lab  Results   Component Value Date    Valp Acid <2.8 (L) 11/21/2021    Lithium <0.05 (L) 09/29/2017     Lab Results   Component Value Date    Vitamin B-12 444 04/27/2019    Folate 9.3 11/29/2013    RPR QUALITATIVE Reactive (A) 05/20/2021    Hepatitis C Virus Ab >11.0 (H) 05/20/2021     Recent Results (from the past 168 hour(s))   Urine Drug Screen    Collection Time: 11/21/21  7:09 AM   Result Value Ref Range    Ur PH DOA Scr 6.5 4.5 - 9.0    Amphet Scr Negative Negative    Barb Scr Negative Negative    Benzo Scr Negative Negative    Cannab Scr Negative Negative    Cocaine Scr Positive (A) Negative    Opiates Scr Negative Negative    Meth Scr Negative Negative    Oxyco Scr Negative Negative    Fentanyl Scr Positive (A) Negative     Reviewed Last EKG on File  ECG 12 lead    Result Date: 11/21/2021  Diagnosis Class Abnormal Acquisition Device MV360 Ventricular Rate 72 Atrial Rate 72 P-R Interval 114 QRS Duration 74 Q-T Interval 382 QTC Calculation(Bazett) 418 Calculated P Axis 67 Calculated R Axis 14 Calculated T Axis -87 Diagnosis Normal sinus rhythm ST & T wave abnormality, consider inferior ischemia ST & T wave abnormality, consider anterolateral ischemia Abnormal ECG When compared with ECG of 01-Jun-2021 22:46, Questionable change in QRS axis T wave inversion now evident in Inferior leads Inverted T waves have replaced nonspecific T wave abnormality in Lateral leads Landry Corporal ([id number]) on 11/21/2021 3:15:58 PM certifies that he/she has reviewed the ECG tracing and confirms the independent  interpretation is correct.    ECG 12 lead    Result Date: 06/02/2021  Diagnosis Class Abnormal Acquisition Device D3K Ventricular Rate 74 Atrial Rate 74 P-R Interval 124 QRS Duration 78 Q-T Interval 396 QTC Calculation(Bazett) 439 Calculated P Axis 78 Calculated R Axis 76 Calculated T Axis 43 Diagnosis Normal sinus rhythm T wave  abnormality, consider anterior ischemia Abnormal ECG When compared with ECG of 19-May-2021 21:13, T wave  inversion no longer evident in Inferior leads Nonspecific T wave abnormality has replaced inverted T waves in Lateral leads QT has shortened Kribbs, Scott ([id number]) on 06/02/2021 5:18:30 AM certifies that he/she has reviewed the ECG tracing and confirms the independent  interpretation is correct.    ECG 12 lead    Result Date: 05/19/2021  Diagnosis Class Abnormal Acquisition Device D3K Systolic BP 105 Diastolic BP 58 Ventricular Rate 74 Atrial Rate 74 P-R Interval 118 QRS Duration 76 Q-T Interval 446 QTC Calculation(Bazett) 495 Calculated P Axis 68 Calculated R Axis 94 Calculated T Axis 59 Diagnosis Normal sinus rhythm Rightward axis ST & T wave abnormality, consider inferior ischemia ST & T wave abnormality, consider anterolateral ischemia Prolonged QT Abnormal ECG When compared with ECG of 01-Jan-2021 20:28, T wave inversion now evident in Inferior leads T wave inversion now evident in Lateral leads Herb Grays ([id number]) on 05/19/2021 9:58:24 PM certifies that he/she has reviewed the ECG tracing and confirms the independent  interpretation is correct.    ECG 12 lead    Result Date: 01/01/2021  Diagnosis Class Abnormal Acquisition Device D3K Systolic BP 128 Diastolic BP 79 Ventricular Rate 71 Atrial Rate 71 P-R Interval 118 QRS Duration 80 Q-T Interval 450 QTC Calculation(Bazett) 489 Calculated P Axis 73 Calculated R Axis 95 Calculated T Axis 57 Diagnosis Normal sinus rhythm Rightward axis T wave abnormality, consider anterior ischemia Prolonged QT Abnormal ECG When compared with ECG of 25-Jul-2020 18:17, T wave inversion no longer evident in Inferior leads T wave inversion more evident in Anterior leads T wave inversion no longer evident in Lateral leads Sondra Come ([id number]) on 01/01/2021 9:19:00 PM certifies that he/she has reviewed the ECG tracing and confirms the independent  interpretation is correct.    Imaging on File (last 24 hours)  No results found.    Metabolic Screening:  Team to review results with patient prior  to discharge as applicable (e.g. if patient on antipsychotics at discharge)    BMI: Estimated body mass index is 21.63 kg/m as calculated from the following:    Height as of an earlier encounter on 11/21/21:  (1.651 m).    Weight as of an earlier encounter on 11/21/21: 130 lb (59 kg).  Labs from EMR:  Lab Results   Component Value Date    Glucose, POC 86 11/22/2021     Hemoglobin A1c   Date Value Ref Range Status   07/24/2021 5.5 4.8 - 5.6 % Final     Mental Status Evaluation     Constitutional:    General Appearance   alertness:  awake and alert and age:  appears stated age   General Behavior   Engaged    Musculoskeletal:    Gait and Station   no gait abnormalities noticed   Strength and tone   normal   Psychiatric:    Psychomotor Activity   No motor abnormality noticed   Speech    spontaneous, articulate and soft and no disorders of speech production   Mood    consistent with speech content, anxious, concerned and depressed   Affect    restricted   Thought Process   concrete     Thought Content/Perceptual Disturbances   suicidal ideation and depressed content    Cognition/Sensorium    alert and oriented x4, attention span intact and fund of knowledge intact   Insight   moderate impairment -  has only a vague recognition of psychiatric disorder, with fluctuations in acknowledgement of being ill or having major symptoms; rationalizes need for treatment in terms of lesser symptoms such as anxiety or sleep difficulties.   Judgement   Poor:  The patient has poor awareness about limitations, most plans are not consistent with current capacity, and many actions are not appropriate.  Patient requires frequent redirection. At times it is not possible to convince the patient of their current capacity or inappropriate actions, possibly leading to episodes of irritability, aggression or noncompliance     Measurement Based Care Review      (RETIRED) CIWA-Ar  BP: 116/70 (11/22/21 0550)  Heart Rate: 64 (11/22/21 0550)      Measurement Based Care Review:  PHQ9        11/21/2021 06/01/2021 05/25/2021 05/19/2021 05/14/2021   Depression Screen   Wish to be Dead: Yes    No No Yes No No   Suicidal Thoughts: Yes    Yes No No No No   Sucidal Thoughts with Method (without Specific or Intent to Act): Yes    No       Suicidal Intent (without Specific Plan): No    No       Suicide Intent with Specific Plan: No    No       Suicide Behavior Question: No    No No No No No   C-SSRS Screening Result Moderate Risk    Low Risk No Risk Low Risk No Risk No Risk        Multiple values from one day are sorted in reverse-chronological order         GAD7 Review       View : No data to display.               Electronically signed by:  Burton Apley, MD  11/22/2021 8:03 AM    *The following note was dictated using voice recognition software.  Although the note was reviewed prior to submission, spelling errors and misused words could occur.  Please be aware that these errors may be present.  Please contact me if there is any concern that an error is present that would alter the meaning of the note, it was not intention of this note*  Electronically signed by Burton Apley, MD at 11/23/2021  2:12 PM EDT

## 2021-11-22 NOTE — Progress Notes (Signed)
Formatting of this note is different from the original.     11/22/21 1700   BH DID NOT ATTEND GROUP   Attendance Status Did Not Attend   Did not Attend Group Type Rec Therapy-Leisure Education   Reason for Not Attending Group Patient Refused  (Pt was found laying in bed at the time of the group invitation. Pt had blanket pulled over head and LRT was unsure if pt was asleep or not. Pt did not acknowledge LRT attempts to wake pt.)   Follow-up for Not Attending Group Therapy staff will continue to encourage pt to attend group sessions.     Kerry Dory, LRT CTRS  11/22/2021 / 6:23 PM      Electronically signed by Kerry Dory, LRT CTRS at 11/22/2021  6:23 PM EST

## 2021-11-22 NOTE — Nursing Note (Signed)
Formatting of this note might be different from the original.  Alert, awake, oriented. Patient has slept all day. Isolative to room. Only ate dinner. Refused breakfast and lunch. Has flat affect. Up ab lib, when getting oob. Patient has not attended any group therapies today. Compliant with taking medication. No SI or self harm. C/o having withdrawal. Ativan 0.5mg  given at 1425. 1525 patient observed sleeping.  Electronically signed by Ronelle Nigh, RN, BSN at 11/22/2021  6:37 PM EST

## 2021-11-22 NOTE — Care Plan (Signed)
Formatting of this note might be different from the original.    Problem: Coping - Ineffective, Patient/Family  Goal: Effective coping  Outcome: Not Progressing    Problem: Discharge Planning  Goal: Knowledge of treatment plan (Why is it important for me to do this?)  Outcome: Not Progressing    Problem: Discharge Planning  Goal: Knowledge of medication management  Outcome: Progressing    Problem: Physical Injury, Risk of - to Self or Others  Goal: Absence of self-harm  Outcome: Progressing    Problem: Physical Injury, Risk of - to Self or Others  Goal: Decrease in suicidal ideations  Outcome: Progressing   Patient has flat affect. Isolative to room. Slept all day until dinner and back to bed. Has not attended any group. Compliant with taking medication. No SI or self harm.  Electronically signed by Ronelle Nigh, RN, BSN at 11/22/2021  6:31 PM EST

## 2021-11-22 NOTE — Progress Notes (Signed)
Formatting of this note is different from the original.     11/22/21 0900   BH DID NOT ATTEND GROUP   Attendance Status Did Not Attend   Did not Attend Group Type Rec Therapy-Decision Making   Reason for Not Attending Group Patient Refused  (Pt was found alying in bed with blanket pulled up the her shoulder. LRT encouraged pt to attend group session and pt shook her head "no".)   Follow-up for Not Attending Group Therapy staff will continue to encourage pt to attend future group sessios. Educational handout was made available.     Kerry Dory, LRT CTRS  11/22/2021 / 9:57 AM      Electronically signed by Kerry Dory, LRT CTRS at 11/22/2021  9:57 AM EST

## 2021-11-23 NOTE — Nursing Note (Signed)
Formatting of this note might be different from the original.  Patient reporting anxiety and is requesting PRN medication. PRN Ativan administered at this time and was found to be affective during re-assessment.  Electronically signed by Silvestre Mesi, RN at 11/23/2021 12:01 PM EDT

## 2021-11-23 NOTE — Progress Notes (Signed)
Formatting of this note is different from the original.  [facility]  [facility] -  Inpatient Progress Note    Date of Service: 11/23/2021  Length of stay: Hospital Day: 3  Assessment     Hospital Diagnoses:  Principal Problem:    Bipolar 1 disorder (*)  Active Problems:    Type 2 diabetes mellitus, without long-term current use of insulin (*)    Moderate cocaine use disorder (*)    PTSD (post-traumatic stress disorder)    Persistent complex bereavement disorder    Formulation and MDM:   *a 51 year old African American female with a history of schizophrenia, bipolar disorder, depression, and polysubstance use transferred from [facility] ED for SI in-context of Prior trauma    Need for continued hospitalization:  cannot contract for safety outside of the hospital, lack of support outside of hospital and ongoing medication management and adjustment that cannot safely be done as an outpatient.  Patient has need for ongoing medication management, unit milieu and group therapy in order to address current diagnosis and patient safety and/or safety of others.  Outpatient support will be sought in order to provide safe discharge and ensure appropriate, consistent aftercare.      Treatment Plan     - Precautions:   - Suicide, fall and /or withdrawal  - Commitment status:  voluntary  - Goals and Interventions:   - Group and milieu therapy with family/outpatient support meeting as tolerated. Treatment plan focuses on modifiable risk factors and includes: level of care coordination and recommendations, risks/benefits/alternatives to treatment including common side effects and black box warnings on medication, risk factor reduction, safety goals/plan, importance of compliance with chosen treatment and firearms access reduction plan.              - Other issues to be addressed:  safety planning, housing, aftercare, addiction, medication compliance, ability to attend scheduled appointments, titration  of medication with maximized efficacy, consideration of adjunct medications and/or ECT.                - Treatment goals will address insight and acceptance of mental illness, coping skills, communication skills, mood, anxiety, sleep and appetite, suicidal or homicidal ideation, psychosis, self-injurious behaviors, aggression, improving independence, improving social relationships, domestic violence, and other issues as appropriate.      Disposition:   unkw    - Medications:  ? cloNIDine  0.1 mg Oral ONCE   ? divalproex sodium  250 mg Oral Daily   ? FLUoxetine  20 mg Oral Daily   ? insulin lispro protamine-insulin lispro  4 Units Subcutaneous BID MEALS   ? metFORMIN  500 mg Oral Daily with breakfast   ? nitrofurantoin (macrocrystal-monohydrate)  100 mg Oral BID MEALS   ? THERA  1 tablet Oral Daily       - Pertinent Labs:              - Reviewed.  All labs obtained and reviewed from the current EMR, unless otherwise stated.    - Consults:              - none  - Collateral information:               - SW working on it    - Estimated duration of hospitalization:   -  3 to 5   days   -  I certify that the patient does need, on a daily basis, active treatment furnished  directly by or requiring the supervision of inpatient psychiatric facility personnel.  For inpatient care, physician will direct treatment team plan within three days of admission and at least weekly thereafter.     Chief Complaint     Follow up assessment : "I am feeling same".     Interval History     Patient was seen today for re-evaluation and discussed in treatment team.   The patient has no issues with performing ADLs.  Patient has been medication compliant.  The patient reports no side effects from medications. Since yesterday's assessment, patient's symptoms have stayed the same.      Per nursing,   Patient is calm and cooperative during assessment and morning medication pass and is compliant with medication. Patient endorses SI; however, she  states she does not have a plan or intention of acting on thoughts. Patient able to contract for safety. Patient still reporting generalized pain. Patient states goal for the day is "to be out of room more".  During the assessment, patient was seen in her room.  She was sleeping and having a blanket on.  Writer discussed about her mood and how she is feeling on the rate of 0-10, she rated as 3.  She is feeling the same as of yesterday and more anxious, she has been asking medication for fentanyl withdrawal.  Writer discussed that she has not been using fentanyl on a regular basis and her symptomatology does not suffice the need of opioid withdrawal treatment but provided one-time dose of clonidine.  Patient has not been going to any groups and activity, Clinical research associate encouraged her to participate in that.  We will continue to monitor her progress.    Current suicidal/homicidal ideations:  Passive suicidal ideation without plan or intent and Cannot contract for safety outside of hospital  Current auditory/visual hallucinations: Denies hallucinations and does not appear to be responding to internal stimuli  Current delusions: Denies and patient does not appear to hold any delusions  Current paranoia:  Denies  Current withdrawal symptoms necessitating inpatient detox:  COWS score: 2  Sleep:  no trouble sleeping  Appetite: is normal  Hygiene:  clean    Review Of Systems:  A 10 point review of systems of the following systems was conducted (Constitutional, Psychiatric, Neurological, Musculoskeletal, Eyes, Gastrointestinal, Cardiovascular, Respiratory, Skin, and Endocrine). All reviewed systems are negative/stable except pertinent positives listed as follows: Behavioral/Psych: positive as mentioned in HPI / Mental status examination.    Documented Sleep Last 24 Hours (hours): 15.25 Last BM Date: 11/19/21     Past History     Past Medical History, Past Surgical History, Allergies, Social History, and Family History were reviewed  and updated as appropriate.      Social History:   Social History     Socioeconomic History   ? Marital status: Single     Spouse name: Not on file   ? Number of children: Not on file   ? Years of education: Not on file   ? Highest education level: Not on file   Occupational History   ? Not on file   Tobacco Use   ? Smoking status: Some Days     Packs/day: 0.50     Types: Cigarettes     Last attempt to quit: 05/08/2019     Years since quitting: 2.5   ? Smokeless tobacco: Former   Advertising account planner   ? Vaping Use: Some days   Substance and Sexual Activity   ? Alcohol  use: No     Alcohol/week: 0.0 standard drinks   ? Drug use: Not Currently     Frequency: 1.0 times per week     Types: Marijuana, "Crack" cocaine     Comment: last use 7 months ago per pt   ? Sexual activity: Not Currently   Other Topics Concern   ? Not on file   Social History Narrative   ? Not on file     Social Determinants of Health     Financial Resource Strain: Medium Risk   ? Difficulty of Paying Living Expenses: Somewhat hard   Food Insecurity: Not on file   Transportation Needs: No Transportation Needs   ? Lack of Transportation (Medical): No   ? Lack of Transportation (Non-Medical): No   Physical Activity: Not on file   Stress: Stress Concern Present   ? Feeling of Stress : Very much   Social Connections: Not on file   Intimate Partner Violence: Not on file   Housing Stability: Not on file     Evaluation     Vitals:   Vitals:    11/23/21 0643   BP: (!) 103/59   Pulse: 62   Resp: 18   Temp: 97.4 F (36.3 C)   SpO2: 99%     Medications:  ? cloNIDine  0.1 mg Oral ONCE   ? divalproex sodium  250 mg Oral Daily   ? FLUoxetine  20 mg Oral Daily   ? insulin lispro protamine-insulin lispro  4 Units Subcutaneous BID MEALS   ? metFORMIN  500 mg Oral Daily with breakfast   ? nitrofurantoin (macrocrystal-monohydrate)  100 mg Oral BID MEALS   ? THERA  1 tablet Oral Daily     aluminum & magnesium hydroxide-simethicone, diphenhydrAMINE **OR** diphenhydrAMINE,  ibuprofen, OLANZapine zydis **OR** OLANZapine, ondansetron **OR** ondansetron, polyethylene glycol, traZODone **AND** traZODone    Labs:  Recent Results (from the past 72 hour(s))   CBC And Differential    Collection Time: 11/21/21  6:36 AM   Result Value Ref Range    WBC 7.9 3.7 - 11.0 thou/mcL    RBC 3.79 (L) 4.01 - 4.90 million/mcL    HGB 9.0 (L) 12.2 - 14.9 gm/dL    HCT 45.4 (L) 09.8 - 47.9 %    MCV 78 (L) 82 - 98 fL    MCH 23.7 (L) 27.0 - 33.0 pg    MCHC 30.4 (L) 31.0 - 37.0 gm/dL    Plt Ct 119 147 - 829 thou/mcL    RDW SD 61.2 (H) 36.0 - 47.0 fL    MPV 8.5 (L) 8.9 - 11.2 fL    NRBC% 0.0 0 /100WBC    NRBC 0.000 0 thou/mcL    NEUTROPHIL % 70.6 (H) 50.0 - 70.0 %    LYMPHOCYTE % 19.0 (L) 25.0 - 40.0 %    MONOCYTE % 8.3 4.0 - 12.0 %    Eosinophil % 1.3 1.0 - 6.0 %    BASOPHIL % 0.5 0.0 - 2.0 %    IG% 0.300 0.001 - 0.429 %    ABSOLUTE NEUTROPHIL COUNT 5.59 1.50 - 7.50 thou/mcL    ABSOLUTE LYMPHOCYTE COUNT 1.5 1.0 - 4.5 thou/mcL    MONO ABSOLUTE 0.7 0.1 - 0.8 thou/mcL    EOS ABSOLUTE 0.1 0.0 - 0.5 thou/mcL    BASO ABSOLUTE 0.0 0.0 - 0.2 thou/mcL    IG ABSOLUTE 0.020 0.001 - 0.031 thou/mcL    Comprehensive Metabolic Panel    Collection Time: 11/21/21  6:36 AM   Result Value Ref Range    Na 138 136 - 146 mmol/L    Potassium 3.7 3.7 - 5.4 mmol/L    Cl 103 97 - 108 mmol/L    CO2 27 20 - 32 mmol/L    AGAP 8 7 - 16 mmol/L    Glucose 156 (H) 65 - 99 mg/dL    BUN 8 6 - 24 mg/dL    Creatinine 2.13 (L) 0.57 - 1.00 mg/dL    Ca 9.4 8.7 - 08.6 mg/dL    ALK PHOS 83 25 - 578 U/L    T Bili <0.15 0.00 - 1.20 mg/dL    Total Protein 7.1 6.0 - 8.5 gm/dL    Alb 4.0 3.5 - 5.5 gm/dL    GLOBULIN 3.1 1.5 - 4.5 gm/dL    ALBUMIN/GLOBULIN RATIO 1.3 1.1 - 2.5    BUN/CREAT RATIO 16.0 11.0 - 26.0    ALT 20 0 - 40 U/L    AST 18 0 - 40 U/L    eGFR 115 mL/min/1.31m2   Magnesium    Collection Time: 11/21/21  6:36 AM   Result Value Ref Range    Mg 1.9 1.6 - 2.6 mg/dL   Ethanol    Collection Time: 11/21/21  6:36 AM   Result Value Ref Range    Ethanol  <10 0 mg/dL   ASA    Collection Time: 11/21/21  6:36 AM   Result Value Ref Range    Salicylate <3.0 (L) 30.0 - 250.0 mcg/mL   APAP    Collection Time: 11/21/21  6:36 AM   Result Value Ref Range    Acetaminophen <5.0 (L) 10.0 - 25.0 mcg/mL   Valproic Acid Level, Total    Collection Time: 11/21/21  6:36 AM   Result Value Ref Range    Valp Acid <2.8 (L) 50.0 - 100.0 mcg/mL   Urine Drug Screen    Collection Time: 11/21/21  7:09 AM   Result Value Ref Range    Ur PH DOA Scr 6.5 4.5 - 9.0    Amphet Scr Negative Negative    Barb Scr Negative Negative    Benzo Scr Negative Negative    Cannab Scr Negative Negative    Cocaine Scr Positive (A) Negative    Opiates Scr Negative Negative    Meth Scr Negative Negative    Oxyco Scr Negative Negative    Fentanyl Scr Positive (A) Negative   Urinalysis Only - No Symptoms    Collection Time: 11/21/21  7:09 AM   Result Value Ref Range    Urine Color Colorless (A) Yellow     Urine Appearance Cloudy (A) Clear    Urine Specific Gravity 1.007 1.005 - 1.030    Urine pH 6.5 5 to 9    Urine Protein - Dipstick Negative Negative mg/dl    Urine Glucose Negative Negative mg/dL    Urine Ketones Negative Negative mg/dl    Urine Bilirubin Negative Negative mg/dL    Urine Blood 4.69 (A) Negative mg/dL    Urine Nitrite Positive (A) Negative    Urine Urobilinogen <2 <2 mg/dl    Urine Leukocyte Esterase 500 (A) Negative Leu/mcL    Urine Squamous Epithelial Cells 14 (H) <=2 /HPF    Urine WBC 23 (H) <=2 /HPF    Urine RBC 10 (H) <=2 /HPF    Urine Bacteria Moderate (A) None Seen /HPF    Urine Mucous Rare (A) None /HPF    UA Microscopic Yes Micro (  A) No Micro   POCT HCG    Collection Time: 11/21/21  7:51 AM   Result Value Ref Range    HCG,POC Negative Negative    Lot Number [id number]     Expiration Date 1,312,024     INTERNAL QC CHECK PERFORMED Acceptable    COVID-19, Flu A+B and RSV Nasopharyngeal Swab Nasopharyngeal Swab    Collection Time: 11/21/21  7:55 AM   Result Value Ref Range    Flu A Negative  Negative    Flu B Negative Negative    RSV PCR Negative Negative    SARS-COV-2 Not Detected Not Detected   POCT Glucose Once (Routine)    Collection Time: 11/21/21 12:08 PM   Result Value Ref Range    Glucose, POC 108 (H) 70 - 99 mg/dL    OPERATOR ID [id number]     INSTRUMENT ID [id number]    POCT Glucose Once    Collection Time: 11/21/21  4:16 PM   Result Value Ref Range    Glucose, POC 158 (H) 70 - 99 mg/dL    OPERATOR ID [id number]     INSTRUMENT ID [id number]    POCT Glucose Once    Collection Time: 11/21/21  7:14 PM   Result Value Ref Range    Glucose, POC 87 70 - 99 mg/dL    OPERATOR ID [id number]     INSTRUMENT ID [id number]    POCT Glucose Once    Collection Time: 11/22/21  7:25 AM   Result Value Ref Range    Glucose, POC 86 70 - 99 mg/dL    OPERATOR ID [id number]     INSTRUMENT ID [id number]    POCT Glucose Once    Collection Time: 11/22/21 11:10 AM   Result Value Ref Range    Glucose, POC 94 70 - 99 mg/dL    OPERATOR ID [id number]     INSTRUMENT ID [id number]    POCT Glucose Once    Collection Time: 11/22/21  4:21 PM   Result Value Ref Range    Glucose, POC 128 (H) 70 - 99 mg/dL    OPERATOR ID [id number]     INSTRUMENT ID [id number]    POCT Glucose Once    Collection Time: 11/22/21  7:31 PM   Result Value Ref Range    Glucose, POC 93 70 - 99 mg/dL    OPERATOR ID [id number]     INSTRUMENT ID [id number]    POCT Glucose Once    Collection Time: 11/23/21  7:08 AM   Result Value Ref Range    Glucose, POC 126 (H) 70 - 99 mg/dL    OPERATOR ID [id number]     INSTRUMENT ID [id number]    POCT Glucose Once    Collection Time: 11/23/21 10:36 AM   Result Value Ref Range    Glucose, POC 117 (H) 70 - 99 mg/dL    OPERATOR ID [id number]     INSTRUMENT ID [id number]    Lipid Panel    Collection Time: 11/23/21 10:52 AM   Result Value Ref Range    CHOLESTEROL TOTAL 194 100 - 199 mg/dL    Trig 77 0 - 175 mg/dL    HDL 66 >=10 mg/dL    LDL 258 (H) 0 - 99 mg/dL    VLDL 15 5 - 40 mg/dl    CHOL/HDL 3 0 - 5     Imaging Results:  No results  found.    Results for orders placed or performed during the hospital  encounter of 11/21/21   ECG 12 lead    Narrative    Diagnosis Class Abnormal  Acquisition Device MV360  Ventricular Rate 72  Atrial Rate 72  P-R Interval 114  QRS Duration 74  Q-T Interval 382  QTC Calculation(Bazett) 418  Calculated P Axis 67  Calculated R Axis 14  Calculated T Axis -87    Diagnosis Normal sinus rhythm  ST & T wave abnormality, consider inferior ischemia  ST & T wave abnormality, consider anterolateral ischemia  Abnormal ECG  When compared with ECG of 01-Jun-2021 22:46,  Questionable change in QRS axis  T wave inversion now evident in Inferior leads  Inverted T waves have replaced nonspecific T wave abnormality in Lateral leads  Landry Corporal ([id number]) on 11/21/2021 3:15:58 PM certifies that he/she has reviewed the ECG tracing and confirms the independent  interpretation is correct.     Anticonvulsant Drug Levels:  Recent Results (from the past 16109 hour(s))   Valproic Acid Level, Total    Collection Time: 11/21/21  6:36 AM   Result Value Ref Range    Valp Acid <2.8 (L) 50.0 - 100.0 mcg/mL     Mental Status Evaluation     Constitutional:    General Appearance  alertness:  awake and alert and age:  appears stated age   General Behavior   eye contact:  good and no abnormal motor behaviors   Musculoskeletal:    Gait and Station   no gait abnormalities   Strength and tone   Normal   Psychiatric:    Psychomotor Activity   No motor abnormalities   Speech    spontaneous, articulate and soft and no disorders of speech production   Mood    appropriate to circumstances, consistent with speech content and neutral   Affect    congruent with speech content and restricted   Thought Process   concrete     Thought Content/Perceptual Disturbances   depressed content  and denies S/HI, A/VH, delusions, IOR, paranoia, grandiosity, increased goal directed activity, preoccupations, obsessions, compulsions, or phobias   Cognition/Sensorium    alert and oriented  x4, attention span intact and fund of knowledge intact   Insight   mild impairment - recognizes having psychiatric disorder but clearly underestimates its seriousness, the implications for treatment, or the importance of taking measures to avoid relapse, with poorly conceived future planning.   Judgement   Fair:  The patient is not fully aware of limitations, some plans are clearly not consistent with current capacity, or unable to always conform actions to situations appropriately.  It takes an effort to convince patient of their current capacity or inappropriate actions, and patient may need frequent reminders or redirection to ensure compliance     - Total time spent with patient face-to-face was 25 minutes of which 17 min(>50% of total time) was spent on counseling and/or coordinating care regarding: Patient's education, Diagnostic results, Prognosis, Risks and benefits of treatment options, instructions for treatment and/or follow-up, importance of adherence to chosen treatment and Risk factor reduction.    Electronically signed by:  Burton Apley, MD  11/23/2021 2:47 PM    *The following note was dictated using voice recognition software.  Although the note was reviewed prior to submission, spelling errors and misused words could occur.  Please be aware that these errors may be present.  Please contact me if there is any concern that an error is present that would alter the meaning of the note, it was not intention  of this note*  Electronically signed by Burton Apley, MD at 11/23/2021  2:53 PM EDT

## 2021-11-23 NOTE — Care Plan (Signed)
Formatting of this note might be different from the original.    Problem: Discharge Planning  Goal: Knowledge of treatment plan (Why is it important for me to do this?)  Outcome: Progressing  Note: Patient states she is aware of treatment plan.  Goal: Knowledge of medication management  Outcome: Progressing  Note: Patient states she is aware of medications being administered.    Problem: Physical Injury, Risk of - to Self or Others  Goal: Absence of self-harm  Outcome: Progressing    Problem: Coping - Ineffective, Patient/Family  Goal: Effective coping  Outcome: Not Progressing  Note: Patient has been staying in room most of the morning.    Problem: Pain - Acute  Goal: Reduced pain sensation  Outcome: Not Progressing  Note: Patient still reporting 10/10 generalized pain.    Problem: Physical Injury, Risk of - to Self or Others  Goal: Decrease in suicidal ideations  Outcome: Not Progressing  Note: Patient reporting passive SI.    Patient is calm and cooperative during assessment and morning medication pass and is compliant with medication. Patient endorses SI; however, she states she does not have a plan or intention of acting on thoughts. Patient able to contract for safety. Patient still reporting generalized pain. Patient states goal for the day is "to be out of room more".    Electronically signed by Silvestre Mesi, RN at 11/23/2021  9:55 AM EDT

## 2021-11-23 NOTE — Nursing Note (Signed)
Formatting of this note might be different from the original.  Pt remained in her room for mot of evening but did attend group  Pt encouraged to increase food and fluid intake,  Pt ate snack bar and coke for HS snack. FSBS HS 93.  pts mood depressed.  Denies SI, HI, VH  States she hears voices telling her to harm her self . Agrees to seek out staff for feelings of self harm and she agrees to do so. . States she has neuropathy and her body "hurts all over". ,.   Medicated with prn trazodone, ativan for c/o anxiety and IBU for pain.  meds effective with pt asleep within the hour .   Electronically signed by Reece Leader, RN at 11/23/2021  6:18 AM EDT

## 2021-11-23 NOTE — Nursing Note (Signed)
Formatting of this note might be different from the original.  Patient reporting 10/10 generalized pain and is requesting PRN Ibuprofen. PRN Ibuprofen administered at this time and was found to be affective upon re-assessment.  Electronically signed by Silvestre Mesi, RN at 11/23/2021 12:03 PM EDT

## 2021-11-23 NOTE — Nursing Note (Signed)
Formatting of this note might be different from the original.  Patient reporting increased anxiety and has been requesting Ativan for about 45 minutes and was informed it was too early for next dose. New dose of PRN Ativan can now be administered. PRN Ativan administered at this time.  Electronically signed by Silvestre Mesi, RN at 11/23/2021  2:47 PM EDT

## 2021-11-24 NOTE — Progress Notes (Signed)
Formatting of this note is different from the original.     11/24/21 1700   BH DID NOT ATTEND GROUP   Attendance Status Did Not Attend   Did not Attend Group Type Rec Therapy- Art   Reason for Not Attending Group Patient Refused  (Pt was laying in bed with the sheets over her body and responded "no" to group invitation.)   Follow-up for Not Attending Group Therapy staff will continue to encourage future group attendance. Educational handout was made available to pt.     Deon Pilling, LRT CTRS  11/24/2021 / 5:38 PM      Electronically signed by Deon Pilling, LRT CTRS at 11/24/2021  5:38 PM EDT

## 2021-11-24 NOTE — Nursing Note (Signed)
Formatting of this note might be different from the original.  Therapist notified this Clinical research associate that Jiali was having withdrawals. After speaking with and assessingTamika she's not experiencing any physical symptoms of opioid withdrawal but states she has cravings. Dinnertime medications given. Currently resting in bed. Monitoring continues per protocol for safety.   Electronically signed by Ocie Doyne, RN at 11/24/2021  5:27 PM EDT

## 2021-11-24 NOTE — Care Plan (Signed)
Formatting of this note might be different from the original.    Problem: Discharge Planning  Goal: Knowledge of medication management  Outcome: Progressing  Shari Henderson questions medications that are given to her and is able to recall purpose. She has been medication compliant so far this shift.     Problem: Physical Injury, Risk of - to Self or Others  Goal: Absence of self-harm  Outcome: Progressing  Shari Henderson has not had any incidents of self-harm so far this shift.   Goal: Decrease in suicidal ideations  Outcome: Progressing  Shari Henderson denies SI.       Shari Henderson is alert and oriented. Denies SI/HI/AVH. PRNs given for anxiety and pain with good result. Isolative most of shift so far today. Voices no concerns. Resting in bed at this time. Monitoring continues for safety.   Electronically signed by Ocie Doyne, RN at 11/24/2021  2:38 PM EDT
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
## 2021-11-24 NOTE — Progress Notes (Signed)
Formatting of this note is different from the original.     11/24/21 1430   BH DID NOT ATTEND GROUP   Attendance Status Did Not Attend   Did not Attend Group Type Rec Therapy- Stress Management   Reason for Not Attending Group Patient Refused  (Pt was laying in bed and responded "yes" to group invitation. Pt did not present to group.)   Follow-up for Not Attending Group Therapy staff will continue to encourage future group attendance. Educational handout was made available to pt.     Deon Pilling, LRT CTRS  11/24/2021 / 3:32 PM      Electronically signed by Deon Pilling, LRT CTRS at 11/24/2021  3:32 PM EDT

## 2021-11-24 NOTE — Progress Notes (Signed)
Formatting of this note is different from the original.  [facility]  [facility] -  Inpatient Progress Note    Date of Service: 11/24/2021  Length of stay: Hospital Day: 4  Assessment     Hospital Diagnoses:  Principal Problem:    Bipolar 1 disorder (*)  Active Problems:    Type 2 diabetes mellitus, without long-term current use of insulin (*)    Moderate cocaine use disorder (*)    PTSD (post-traumatic stress disorder)    Persistent complex bereavement disorder    Formulation and MDM:  Pt is a 51 year old homeless African American female with a history of schizophrenia, bipolar disorder, depression, and polysubstance use transferred from [facility] ED for SI in-context of Prior trauma/UDS reflects abuse of fentanyl/cocaine.    Need for continued hospitalization: unable to provide for own ADLs or to provide shelter and/or food, unable to provide self-protection and safety, unable to seek out and adhere to medical care, unable to exercise self-control, good judgement, and discretion in the conduct of daily responsibilities, ongoing medication management and adjustment that cannot safely be done as an outpatient and noncompliance.  Patient has need for ongoing medication management, unit milieu and group therapy in order to address current diagnosis and patient safety and/or safety of others.  Outpatient support will be sought in order to provide safe discharge and ensure appropriate, consistent aftercare.       Treatment Plan     - Disposition:   - continued psychiatric hospitalization justified due to high probability of danger if discharged with imminent rehospitalization likely  - Commitment Status: Voluntary  - Estimated length of stay:   - 2-3 d  - Precautions:   - DT/ETOH  - Pertinent Labs:   - no new data   - Psych Meds:   - add Neurontin and robaxin- for cocaine induced akathisia   - Medical Recs:   - as above    Chief Complaint     "I am shaky"    Interval History     Patient  was seen today for re-evaluation.  Nursing reports compliance.   The patient reports no issues with performing ADLs.  Patient has been medication compliant.  The patient reports no side effects from medications.  Current symptoms being addressed include:  addiction, withdrawal, cravings, anxiety and mood stabilization.  Since last assessment, patient reports symptoms have improved marginally.      On assessment 3/13:  Pt seen - has akathisia yet BP low- so cannot tolerate detox beyond robaxin/neurontin off label  Mood anxious no current SI/HI- some passive SI  No paranoia   No acute psychosis   Compliant  Slept >11 hrs    Current suicidal/homicidal ideations: Denies  Current auditory/visual hallucinations: Denies    Review Of Systems:  A complete review of systems of the following systems was conducted (Constitutional, Psychiatric, Neurological, Musculoskeletal, Eyes, Gastrointestinal, Cardiovascular, Respiratory, Skin, and Endocrine). All reviewed systems are negative except pertinent positives identified in the HPI.    Documented Sleep Last 24 Hours (hours): 11.25 Last BM Date: 11/19/21     Past History     Past Medical History, Past Surgery History, Allergies, Social History, and Family History were reviewed and updated as appropriate.      Social History:   Social History     Socioeconomic History   ? Marital status: Single     Spouse name: Not on file   ? Number of children: Not on  file   ? Years of education: Not on file   ? Highest education level: Not on file   Occupational History   ? Not on file   Tobacco Use   ? Smoking status: Some Days     Packs/day: 0.50     Types: Cigarettes     Last attempt to quit: 05/08/2019     Years since quitting: 2.5   ? Smokeless tobacco: Former   Advertising account planner   ? Vaping Use: Some days   Substance and Sexual Activity   ? Alcohol use: No     Alcohol/week: 0.0 standard drinks   ? Drug use: Not Currently     Frequency: 1.0 times per week     Types: Marijuana, "Crack" cocaine      Comment: last use 7 months ago per pt   ? Sexual activity: Not Currently   Other Topics Concern   ? Not on file   Social History Narrative   ? Not on file     Social Determinants of Health     Financial Resource Strain: Medium Risk   ? Difficulty of Paying Living Expenses: Somewhat hard   Food Insecurity: Not on file   Transportation Needs: No Transportation Needs   ? Lack of Transportation (Medical): No   ? Lack of Transportation (Non-Medical): No   Physical Activity: Not on file   Stress: Stress Concern Present   ? Feeling of Stress : Very much   Social Connections: Not on file   Intimate Partner Violence: Not on file   Housing Stability: Not on file     Evaluation     Vitals:   Vitals:    11/24/21 0611   BP: 107/75   Pulse: 78   Resp: 18   Temp: 98.2 F (36.8 C)   SpO2: 100%     Medications:  ? divalproex sodium  250 mg Oral Daily   ? FLUoxetine  20 mg Oral Daily   ? insulin lispro protamine-insulin lispro  4 Units Subcutaneous BID MEALS   ? metFORMIN  500 mg Oral Daily with breakfast   ? methocarbamol  750 mg Oral TID   ? nitrofurantoin (macrocrystal-monohydrate)  100 mg Oral BID MEALS   ? perphenazine  2 mg Oral BID   ? THERA  1 tablet Oral Daily     aluminum & magnesium hydroxide-simethicone, diphenhydrAMINE **OR** diphenhydrAMINE, ibuprofen, OLANZapine zydis **OR** OLANZapine, ondansetron **OR** ondansetron, polyethylene glycol, traZODone **AND** traZODone    Labs:  Recent Results (from the past 72 hour(s))   POCT Glucose Once (Routine)    Collection Time: 11/21/21 12:08 PM   Result Value Ref Range    Glucose, POC 108 (H) 70 - 99 mg/dL    OPERATOR ID [id number]     INSTRUMENT ID [id number]    POCT Glucose Once    Collection Time: 11/21/21  4:16 PM   Result Value Ref Range    Glucose, POC 158 (H) 70 - 99 mg/dL    OPERATOR ID [id number]     INSTRUMENT ID [id number]    POCT Glucose Once    Collection Time: 11/21/21  7:14 PM   Result Value Ref Range    Glucose, POC 87 70 - 99 mg/dL    OPERATOR ID [id number]      INSTRUMENT ID [id number]    POCT Glucose Once    Collection Time: 11/22/21  7:25 AM   Result Value Ref Range    Glucose, POC 86 70 - 99 mg/dL  OPERATOR ID [id number]     INSTRUMENT ID [id number]    POCT Glucose Once    Collection Time: 11/22/21 11:10 AM   Result Value Ref Range    Glucose, POC 94 70 - 99 mg/dL    OPERATOR ID [id number]     INSTRUMENT ID [id number]    POCT Glucose Once    Collection Time: 11/22/21  4:21 PM   Result Value Ref Range    Glucose, POC 128 (H) 70 - 99 mg/dL    OPERATOR ID [id number]     INSTRUMENT ID [id number]    POCT Glucose Once    Collection Time: 11/22/21  7:31 PM   Result Value Ref Range    Glucose, POC 93 70 - 99 mg/dL    OPERATOR ID [id number]     INSTRUMENT ID [id number]    POCT Glucose Once    Collection Time: 11/23/21  7:08 AM   Result Value Ref Range    Glucose, POC 126 (H) 70 - 99 mg/dL    OPERATOR ID [id number]     INSTRUMENT ID [id number]    POCT Glucose Once    Collection Time: 11/23/21 10:36 AM   Result Value Ref Range    Glucose, POC 117 (H) 70 - 99 mg/dL    OPERATOR ID [id number]     INSTRUMENT ID [id number]    Lipid Panel    Collection Time: 11/23/21 10:52 AM   Result Value Ref Range    CHOLESTEROL TOTAL 194 100 - 199 mg/dL    Trig 77 0 - 254 mg/dL    HDL 66 >=27 mg/dL    LDL 062 (H) 0 - 99 mg/dL    VLDL 15 5 - 40 mg/dl    CHOL/HDL 3 0 - 5   POCT Glucose Once    Collection Time: 11/23/21  3:01 PM   Result Value Ref Range    Glucose, POC 184 (H) 70 - 99 mg/dL    OPERATOR ID [id number]     INSTRUMENT ID [id number]    POCT Glucose Once    Collection Time: 11/23/21  6:33 PM   Result Value Ref Range    Glucose, POC 159 (H) 70 - 99 mg/dL    OPERATOR ID [id number]     INSTRUMENT ID [id number]    POCT Glucose Once    Collection Time: 11/24/21  7:10 AM   Result Value Ref Range    Glucose, POC 102 (H) 70 - 99 mg/dL    OPERATOR ID [id number]     INSTRUMENT ID [id number]      Imaging Results:  No results found.    Results for orders placed or performed during the hospital encounter of  11/21/21   ECG 12 lead    Narrative    Diagnosis Class Abnormal  Acquisition Device MV360  Ventricular Rate 72  Atrial Rate 72  P-R Interval 114  QRS Duration 74  Q-T Interval 382  QTC Calculation(Bazett) 418  Calculated P Axis 67  Calculated R Axis 14  Calculated T Axis -87    Diagnosis Normal sinus rhythm  ST & T wave abnormality, consider inferior ischemia  ST & T wave abnormality, consider anterolateral ischemia  Abnormal ECG  When compared with ECG of 01-Jun-2021 22:46,  Questionable change in QRS axis  T wave inversion now evident in Inferior leads  Inverted T waves have replaced nonspecific T wave abnormality in Lateral leads  Landry Corporal ([id number]) on 11/21/2021 3:15:58 PM certifies that he/she has reviewed the  ECG tracing and confirms the independent  interpretation is correct.     Anticonvulsant Drug Levels:  Recent Results (from the past 16109 hour(s))   Valproic Acid Level, Total    Collection Time: 11/21/21  6:36 AM   Result Value Ref Range    Valp Acid <2.8 (L) 50.0 - 100.0 mcg/mL     Mental Status Evaluation     MENTAL STATUS EXAM:   General:  Appropriately dressed, well-nourished, alert. No focal deficits. CN II-XII grossly intact. Grossly normal gait and station.  Hygiene: poor  Eye Contact: poor   Attitude: agitated   Motor: akathisia / psychomotor agitation/ w/o: retardation, parkinson movements,TD, tics, tremors, or abnormal movements noted on exam.      Speech / Language: some pressured speech noted  Mood: depressed    Affect: congruent. No mood lability noted on exam.  Thought process: Linear, goal directed, and logical with no LOA or FOI  Thought Content: passive  SI w/o intent, plan, or actions. No evidence of paranoia, delusions, obsessions, or compulsions.   Perception: Denies AH/VH. No evidence that patient is responding to internal stimuli.  Orientation:  Alert and oriented to person, place, time, and situation.  Cognition: modest  difficulties with cognition grossly evident on exam.  Attention  and Concentration: Good, as able to follow the interview process appropriately.   Recent and Remote Memory: Intact and appropriate    Fund of Knowledge: Average   Insight: fair  Judgement: The patient was able to differentiate between rightful and wrongful behavior    - Total time spent with patient face-to-face was 25 minutes of which 20 meds discussed   minutes (>50% of total time) was spent on counseling and/or coordinating care regarding: meds discussed, Diagnostic results, Prognosis, Risks and benefits of treatment options, instructions for treatment and/or follow-up, importance of adherence to chosen treatment, Risk factor reduction and patient and family education.    Electronically signed by:  Mick Sell, MD  11/24/2021 1:06 PM      Electronically signed by Mick Sell, MD at 11/24/2021  1:18 PM EDT

## 2021-11-24 NOTE — Nursing Note (Signed)
Formatting of this note might be different from the original.  Pt in room most of shift but out for HS snack.  Pt ate peanut butter crackers and coke for HS snack.  FSBS HS 159.  Took  meds as ordered.   Eye contact improved. . stated she continued to "want to kill myself".  No intent or plan.,  states she lost her only daughter by gunshot wound 4 years ago.  Denies  HI,  AVHS.states Ibuprofen is not helping with her generalized pain. continues to ask for anti anxiety meds.. medicated with prn trazodone  1 dose which was not effective, then repeated dose 1 hour later with pt sleeping .   Electronically signed by Reece Leader, RN at 11/24/2021  1:39 AM EDT

## 2021-11-25 NOTE — Nursing Note (Signed)
Formatting of this note might be different from the original.  Patient requests Albuterol inhaler for Shortness of Breath.   Medication administered at 0814, with reported relief of symptoms.   Electronically signed by Rhunette Croft, RN at 11/25/2021  7:47 PM EDT

## 2021-11-25 NOTE — Progress Notes (Signed)
Formatting of this note is different from the original.     11/25/21 1300   BH DID NOT ATTEND GROUP   Attendance Status Did Not Attend   Did not Attend Group Type Recreation Therapy-Discharge Planning   Reason for Not Attending Group Sleeping  (Pt was found resting in her bed with covers over her head at time of invitation. Pt did not respond to this Clinical research associate.)   Follow-up for Not Attending Group Therapy staff will continue to encourage pt to attend groups.     Lessie Dings, LRT CTRS  11/25/2021 / 2:16 PM      Electronically signed by Lessie Dings, LRT CTRS at 11/25/2021  2:16 PM EDT

## 2021-11-25 NOTE — Progress Notes (Signed)
Formatting of this note is different from the original.  [facility]  [facility] -  Inpatient Progress Note    Date of Service: 11/25/2021  Length of stay: Hospital Day: 5  Assessment     Hospital Diagnoses:  Principal Problem:    Bipolar 1 disorder (*)  Active Problems:    Type 2 diabetes mellitus, without long-term current use of insulin (*)    Moderate cocaine use disorder (*)    PTSD (post-traumatic stress disorder)    Persistent complex bereavement disorder    Formulation and MDM:  Pt is a 51 year old homeless African American female with a history of schizophrenia, bipolar disorder, depression, and polysubstance use transferred from [facility] ED for SI in-context of Prior trauma/UDS reflects abuse of fentanyl/cocaine.    Need for continued hospitalization: unable to provide for own ADLs or to provide shelter and/or food, unable to provide self-protection and safety, unable to seek out and adhere to medical care, unable to exercise self-control, good judgement, and discretion in the conduct of daily responsibilities, ongoing medication management and adjustment that cannot safely be done as an outpatient and noncompliance.  Patient has need for ongoing medication management, unit milieu and group therapy in order to address current diagnosis and patient safety and/or safety of others.  Outpatient support will be sought in order to provide safe discharge and ensure appropriate, consistent aftercare.       Treatment Plan     - Disposition:   - continued psychiatric hospitalization justified due to high probability of danger if discharged with imminent rehospitalization likely  - Commitment Status: Voluntary  - Estimated length of stay:   - 2-3 d  - Precautions:   - DT/ETOH  - Pertinent Labs:   - no new data   - Psych Meds:   -Continue Neurontin and robaxin- for cocaine induced akathisia /patient requesting Toradol claims she does not actually have an allergy to it so we will give  her that shot she also is given phenobarbital for possible withdrawal symptoms again I do believe were dealing more with akathisia though  - Medical Recs:   - as above    Chief Complaint     "My feet are hurting, I am anxious"    Interval History     Patient was seen today for re-evaluation.  Nursing reports compliance.   The patient reports no issues with performing ADLs.  Patient has been medication compliant.  The patient reports no side effects from medications.  Current symptoms being addressed include:  addiction, withdrawal, cravings, anxiety and mood stabilization.  Since last assessment, patient reports symptoms have improved marginally.      Assessment of 3/14:  Patient continues to report restlessness foot pain seeking medications to include benzodiazepines and pain medications stating she is not in fact allergic to Toradol  No acute paranoia no acute auditory or visual hallucinations  Slept almost 14 hours  Denies current suicidal and homicidal thinking    On assessment 3/13:  Pt seen - has akathisia yet BP low- so cannot tolerate detox beyond robaxin/neurontin off label  Mood anxious no current SI/HI- some passive SI  No paranoia   No acute psychosis   Compliant  Slept >11 hrs    Current suicidal/homicidal ideations: Denies  Current auditory/visual hallucinations: Denies    Review Of Systems:  A complete review of systems of the following systems was conducted (Constitutional, Psychiatric, Neurological, Musculoskeletal, Eyes, Gastrointestinal, Cardiovascular, Respiratory, Skin, and Endocrine).  All reviewed systems are negative except pertinent positives identified in the HPI.    Documented Sleep Last 24 Hours (hours): 13.25 Last BM Date: 11/24/21     Past History     Past Medical History, Past Surgery History, Allergies, Social History, and Family History were reviewed and updated as appropriate.      Social History:   Social History     Socioeconomic History   ? Marital status: Single     Spouse name:  Not on file   ? Number of children: Not on file   ? Years of education: Not on file   ? Highest education level: Not on file   Occupational History   ? Not on file   Tobacco Use   ? Smoking status: Some Days     Packs/day: 0.50     Types: Cigarettes     Last attempt to quit: 05/08/2019     Years since quitting: 2.5   ? Smokeless tobacco: Former   Advertising account planner   ? Vaping Use: Some days   Substance and Sexual Activity   ? Alcohol use: No     Alcohol/week: 0.0 standard drinks   ? Drug use: Not Currently     Frequency: 1.0 times per week     Types: Marijuana, "Crack" cocaine     Comment: last use 7 months ago per pt   ? Sexual activity: Not Currently   Other Topics Concern   ? Not on file   Social History Narrative   ? Not on file     Social Determinants of Health     Financial Resource Strain: Medium Risk   ? Difficulty of Paying Living Expenses: Somewhat hard   Food Insecurity: Not on file   Transportation Needs: No Transportation Needs   ? Lack of Transportation (Medical): No   ? Lack of Transportation (Non-Medical): No   Physical Activity: Not on file   Stress: Stress Concern Present   ? Feeling of Stress : Very much   Social Connections: Not on file   Intimate Partner Violence: Not on file   Housing Stability: Not on file     Evaluation     Vitals:   Vitals:    11/25/21 0608   BP: 128/87   Pulse: 75   Resp: 16   Temp: 98.2 F (36.8 C)   SpO2: 98%     Medications:  ? divalproex sodium  250 mg Oral Daily   ? FLUoxetine  20 mg Oral Daily   ? gabapentin  300 mg Oral TID   ? insulin lispro protamine-insulin lispro  4 Units Subcutaneous BID MEALS   ? metFORMIN  500 mg Oral Daily with breakfast   ? methocarbamol  750 mg Oral TID   ? nitrofurantoin (macrocrystal-monohydrate)  100 mg Oral BID MEALS   ? perphenazine  2 mg Oral BID   ? phenobarbital  32.4 mg Oral TID   ? THERA  1 tablet Oral Daily     albuterol sulfate HFA, aluminum & magnesium hydroxide-simethicone, diphenhydrAMINE **OR** diphenhydrAMINE, ibuprofen, OLANZapine  zydis **OR** OLANZapine, ondansetron **OR** ondansetron, polyethylene glycol, traZODone **AND** traZODone    Labs:  Recent Results (from the past 72 hour(s))   POCT Glucose Once    Collection Time: 11/22/21  4:21 PM   Result Value Ref Range    Glucose, POC 128 (H) 70 - 99 mg/dL    OPERATOR ID [id number]     INSTRUMENT ID [id number]    POCT Glucose  Once    Collection Time: 11/22/21  7:31 PM   Result Value Ref Range    Glucose, POC 93 70 - 99 mg/dL    OPERATOR ID [id number]     INSTRUMENT ID [id number]    POCT Glucose Once    Collection Time: 11/23/21  7:08 AM   Result Value Ref Range    Glucose, POC 126 (H) 70 - 99 mg/dL    OPERATOR ID [id number]     INSTRUMENT ID [id number]    POCT Glucose Once    Collection Time: 11/23/21 10:36 AM   Result Value Ref Range    Glucose, POC 117 (H) 70 - 99 mg/dL    OPERATOR ID [id number]     INSTRUMENT ID [id number]    Lipid Panel    Collection Time: 11/23/21 10:52 AM   Result Value Ref Range    CHOLESTEROL TOTAL 194 100 - 199 mg/dL    Trig 77 0 - 536 mg/dL    HDL 66 >=64 mg/dL    LDL 403 (H) 0 - 99 mg/dL    VLDL 15 5 - 40 mg/dl    CHOL/HDL 3 0 - 5   POCT Glucose Once    Collection Time: 11/23/21  3:01 PM   Result Value Ref Range    Glucose, POC 184 (H) 70 - 99 mg/dL    OPERATOR ID [id number]     INSTRUMENT ID [id number]    POCT Glucose Once    Collection Time: 11/23/21  6:33 PM   Result Value Ref Range    Glucose, POC 159 (H) 70 - 99 mg/dL    OPERATOR ID [id number]     INSTRUMENT ID [id number]    POCT Glucose Once    Collection Time: 11/24/21  7:10 AM   Result Value Ref Range    Glucose, POC 102 (H) 70 - 99 mg/dL    OPERATOR ID [id number]     INSTRUMENT ID [id number]    POCT Glucose Once    Collection Time: 11/24/21  4:15 PM   Result Value Ref Range    Glucose, POC 95 70 - 99 mg/dL    OPERATOR ID [id number]     INSTRUMENT ID [id number]    POCT Glucose Once    Collection Time: 11/25/21  7:45 AM   Result Value Ref Range    Glucose, POC 143 (H) 70 - 99 mg/dL    OPERATOR ID [id number]     INSTRUMENT  ID [id number]      Imaging Results:  No results found.    Results for orders placed or performed during the hospital encounter of 11/21/21   ECG 12 lead    Narrative    Diagnosis Class Abnormal  Acquisition Device MV360  Ventricular Rate 72  Atrial Rate 72  P-R Interval 114  QRS Duration 74  Q-T Interval 382  QTC Calculation(Bazett) 418  Calculated P Axis 67  Calculated R Axis 14  Calculated T Axis -87    Diagnosis Normal sinus rhythm  ST & T wave abnormality, consider inferior ischemia  ST & T wave abnormality, consider anterolateral ischemia  Abnormal ECG  When compared with ECG of 01-Jun-2021 22:46,  Questionable change in QRS axis  T wave inversion now evident in Inferior leads  Inverted T waves have replaced nonspecific T wave abnormality in Lateral leads  Landry Corporal ([id number]) on 11/21/2021 3:15:58 PM certifies that he/she has reviewed the ECG tracing and confirms the independent  interpretation is correct.  Anticonvulsant Drug Levels:  Recent Results (from the past 47829 hour(s))   Valproic Acid Level, Total    Collection Time: 11/21/21  6:36 AM   Result Value Ref Range    Valp Acid <2.8 (L) 50.0 - 100.0 mcg/mL     Mental Status Evaluation     MENTAL STATUS EXAM:   General:  Appropriately dressed, well-nourished, alert. No focal deficits. CN II-XII grossly intact. Grossly normal gait and station.  Hygiene: poor  Eye Contact: poor   Attitude: agitated   Motor: akathisia / psychomotor agitation/ w/o: retardation, parkinson movements,TD, tics, tremors, or abnormal movements noted on exam.      Speech / Language: some pressured speech noted  Mood: depressed    Affect: congruent. No mood lability noted on exam.  Thought process: Linear, goal directed, and logical with no LOA or FOI  Thought Content: passive  SI w/o intent, plan, or actions. No evidence of paranoia, delusions, obsessions, or compulsions.   Perception: Denies AH/VH. No evidence that patient is responding to internal stimuli.  Orientation:  Alert and  oriented to person, place, time, and situation.  Cognition: modest  difficulties with cognition grossly evident on exam.  Attention and Concentration: Good, as able to follow the interview process appropriately.   Recent and Remote Memory: Intact and appropriate    Fund of Knowledge: Average   Insight: fair  Judgement: The patient was able to differentiate between rightful and wrongful behavior    - Total time spent with patient face-to-face was 25 minutes of which 20 meds discussed   minutes (>50% of total time) was spent on counseling and/or coordinating care regarding: meds discussed, Diagnostic results, Prognosis, Risks and benefits of treatment options, instructions for treatment and/or follow-up, importance of adherence to chosen treatment, Risk factor reduction and patient and family education.    Electronically signed by:  Mick Sell, MD  11/25/2021 1:40 PM      Electronically signed by Mick Sell, MD at 11/25/2021  1:41 PM EDT

## 2021-11-25 NOTE — Care Plan (Signed)
Formatting of this note might be different from the original.    Problem: Coping - Ineffective, Patient/Family  Goal: Effective coping  Outcome: Progressing    Problem: Discharge Planning  Goal: Knowledge of treatment plan (Why is it important for me to do this?)  Outcome: Progressing  Goal: Knowledge of medication management  Outcome: Progressing    Problem: Physical Injury, Risk of - to Self or Others  Goal: Absence of self-harm  Outcome: Progressing  Goal: Decrease in suicidal ideations  Outcome: Progressing    Patient denies SI, HI, and hallucinations. Patient remains calm, cooperative, and complaint of medications. Patient complains of continued generalized and back pain. Patient has abstained from self-harm.     On assessment patient states "I'm just ready to go now," and is discharge focused. Patient remains pleasant and cooperative. Patient interacts appropriately with staff and peers.   Electronically signed by Rhunette Croft, RN at 11/25/2021 11:13 AM EDT
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
## 2021-11-26 NOTE — Nursing Note (Signed)
Formatting of this note might be different from the original.  11/25/21    2207  Patient was given albuterol sulfate inhaler 2 puff for shortness of breath.     Patient was given Zyprexa 5 mg tablet for agitation.     2337  Patient was given Ambien 10 mg tablet for sleep.    11/26/21    0037  Ambien was effective. Patient resting in bed with both eyes closed, chest is symmetrical, and breathing unlabored.    Electronically signed by Daine Gravel, LPN at 16/06/9603  1:44 AM EDT

## 2021-11-26 NOTE — Care Plan (Signed)
Formatting of this note might be different from the original.    Problem: Coping - Ineffective, Patient/Family  Goal: Effective coping  Outcome: Not Progressing    Problem: Discharge Planning  Goal: Knowledge of treatment plan (Why is it important for me to do this?)  Outcome: Progressing  Goal: Knowledge of medication management  Outcome: Progressing    Problem: Pain - Acute  Goal: Reduced pain sensation  Outcome: Progressing    Problem: Physical Injury, Risk of - to Self or Others  Goal: Absence of self-harm  Outcome: Progressing  Goal: Decrease in suicidal ideations  Outcome: Progressing     1:1 Patient is alert. Patient is constricted, calm, and guarded. Patient is social with peers. Patient denies SI/HI. Patient vital signs are stable. Patient is medication compliant and reports no side effects. Patient remains free from falls and free from self harm thus far. Patient has shown no verbal/sexual aggression thus far. Brittney,LPN   Electronically signed by Daine Gravel, LPN at 42/59/5638  1:41 AM EDT

## 2021-11-26 NOTE — Progress Notes (Signed)
Formatting of this note is different from the original.     11/26/21 1430   BH DID NOT ATTEND GROUP   Attendance Status Did Not Attend   Did not Attend Group Type Rec Therapy- Relaxation   Reason for Not Attending Group Patient Refused  (Pt was at the nurses station and did not acknowledge LRT during time of invitation.)   Follow-up for Not Attending Group Therapy staff will continue to encourage future group attendance. Educational handout was made available to pt.     Deon Pilling, LRT CTRS  11/26/2021 / 3:32 PM      Electronically signed by Deon Pilling, LRT CTRS at 11/26/2021  3:32 PM EDT

## 2021-11-26 NOTE — Care Plan (Signed)
Formatting of this note might be different from the original.    Problem: Coping - Ineffective, Patient/Family  Goal: Effective coping  Outcome: Progressing  Note: Patient calm and showing no signs of aggression.    Problem: Discharge Planning  Goal: Knowledge of treatment plan (Why is it important for me to do this?)  Outcome: Progressing  Note: Patient states she is aware of treatment plan.  Goal: Knowledge of medication management  Outcome: Progressing  Note: Patient is aware of medications being administered.    Problem: Physical Injury, Risk of - to Self or Others  Goal: Absence of self-harm  Outcome: Progressing  Goal: Decrease in suicidal ideations  Outcome: Progressing  Note: Patient denies SI/HI    Problem: Pain - Acute  Goal: Reduced pain sensation  Outcome: Not Progressing  Note: Patient continuing to state generalized pain is 10/10     Patient is calm and cooperative during assessment and morning medication pass. Patient is medication compliant. Patient denies SI/HI/AVH. Patient continuing to complain of 10/10 generalized pain and states the PRN Ibuprofen does not help. Patient has been in day area more today and has been interacting with her peers more.  Electronically signed by Silvestre Mesi, RN at 11/26/2021 11:41 AM EDT
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
## 2021-11-27 NOTE — Discharge Summary (Signed)
Formatting of this note is different from the original.  [facility]    Discharge Note     Discharge Details   Admit date:         11/21/2021  Discharge date:         11/27/2021   Hospital Days:    6 days    Active Hospital Problems    Diagnosis Date Noted POA   ? *Bipolar 1 disorder (*) 11/21/2021 Yes   ? PTSD (post-traumatic stress disorder) 09/06/2018 Yes   ? 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 Moderate cocaine use disorder (*) 06/24/2017 Yes   ? Type 2 diabetes mellitus, without long-term current use of insulin (*) 01/21/2017 Yes     Resolved Hospital Problems   No resolved problems to display.       Medication List     START taking these medications      Instructions   busPIRone 15 MG tablet  Dose: 15 mg  For: Anxiety Disorder  Commonly known as: BUSPAR     15 mg, Oral, 3 times a day    gabapentin 300 mg capsule  Dose: 300 mg  For: Social Anxiety Disorder  Commonly known as: NEURONTIN  Replaces: gabapentin 800 mg tablet     300 mg, Oral, 3 times a day    nicotine polacrilex 2 mg gum  Dose: 2 mg  For: Nicotine Addiction  Commonly known as: NICORETTE     2 mg, Oral, As needed    perphenazine 2 mg tablet  Dose: 2 mg  For: Psychosis  Commonly known as: TRILAFON     2 mg, Oral, 2 times a day    zolpidem tartrate 10 mg tablet  Dose: 10 mg  For: Trouble Sleeping  Commonly known as: AMBIEN     10 mg, Oral, At bedtime as needed      CHANGE how you take these medications      Instructions   ACCU-CHEK GUIDE test strip  For: Type 2 Diabetes  Generic drug: glucose blood  What changed: Another medication with the same name was removed. Continue taking this medication, and follow the directions you see here.     USE TO CHECK BLOOD SUGARS EIGHT (8) TIMES E11.40 AND Z79.4,  Doctor's comments: 04/20/2021  THIS NOTE IS BY THE PCP PER 03/27/2021 TELEMED VISIT   Did instruct pt that she needs to come in within the next month for any future refills so her BG can be reassessed.   GUIDE    divalproex sodium 250 mg EC tablet  Dose: 250 mg  For: Depressive Phase of Manic-Depression  Start taking on: November 28, 2021  Commonly known as: DEPAKOTE DR  What changed: See the new instructions.     250 mg, Oral, Daily    FLUoxetine 20 mg capsule  Dose: 20 mg  For: Depression  Start taking on: November 28, 2021  Commonly known as: PROZAC  What changed: See the new instructions.     20 mg, Oral, Daily    ONETOUCH ULTRASOFT LANCETS lancets  For: Diabetes  What changed: Another medication with the same name was removed. Continue taking this medication, and follow the directions you see here.     Use as instructed      CONTINUE taking these medications      Instructions   ACCU-CHEK GUIDE w/Device Kit  For: Type 2 Diabetes     USE TO CHECK BLOOD SUGARS EIGHT (8) TIMES  DAILY DX E11.40 AND Z79.4  Doctor's comments: 08/26/2020  DISPENSE ACCU-CHEK GFUIDE METER    albuterol sulfate HFA 108 (90 Base) MCG/ACT inhaler  Dose: 2 puff  For: Asthma  Commonly known as: PROVENTIL,VENTOLIN,PROAIR     2 puffs, Inhalation, Every 6 hours as needed    apixaban 5 mg tablet  Dose: 5 mg  For: History of Venous Thromboembolism  Commonly known as: ELIQUIS     5 mg, Oral, 2 times a day    BD PEN NEEDLE NANO 2ND GEN 32G X 4 MM Misc  For: Diabetes  Generic drug: Insulin Pen Needle     Per sliding scale    HUMULIN 70/30 KWIKPEN (70-30) 100 UNIT/ML Supn injection  For: Type 2 Diabetes  Generic drug: Insulin NPH Isophane & Regular     Inject 4 units twice daily  DX E11.9 and Z79.4  Doctor's comments: Will get 90 day at appointment    hydrOXYzine pamoate 50 mg capsule  Dose: 50 mg  For: Feeling Anxious  Commonly known as: VISTARIL     50 mg, Oral, Every 6 hours as needed    metFORMIN 500 MG tablet  For: Diabetes  Commonly known as: GLUCOPHAGE     TAKE 1 PILL BY MOUTH WITH BREAKFAST  Doctor's comments: Will get 90 day at appointment    [facility] HANDIHALER 18 mcg inhalation capsule  For: Chronic Obstructive Lung Disease  Generic drug: tiotropium  bromide monohydrate     Spiriva with HandiHaler 18 mcg and inhalation capsules   INHALE 9 MICROGRAMS TWICE A DAY BY INHALATION ROUTE.      STOP taking these medications    ALPRAZolam 0.5 mg tablet  Commonly known as: XANAX      cefdinir 300 mg capsule  Commonly known as: OMNICEF      cyclobenzaprine 10 mg tablet  Commonly known as: FLEXERIL      diazepam 5 mg tablet  Commonly known as: VALIUM      gabapentin 800 mg tablet  Commonly known as: NEURONTIN  Replaced by: gabapentin 300 mg capsule          Hospital Course   Attending Physician:       Mick Sell, MD  Consulting Services:        none  Indication for Admission:         Formulation and MDM:  Pt is a 51 year old homeless African American female with a history of schizophrenia, bipolar disorder, depression, and [facility] in-context of Prior trauma/UDS reflects abuse of fentanyl/cocaine.  Hospital Course:        During the patient's stay she displayed no dangerous behaviors here she was cordial and cooperative.    She complained of withdrawal symptoms from cocaine but what she really was experiencing was a protracted case of cocaine induced akathisia we treated this with gabapentin, Robaxin for cramping, and this did resolve.  She was benzodiazepine seeking which we avoided and gave her BuSpar.  She also elaborated on PTSD symptoms after witnessing the murder of her daughter.  She stated she had nightmares but Ambien help that prazosin was not used because of her low blood pressure at baseline.    By the date of the 16th she was requesting discharge home she had no cravings tremors or withdrawal symptoms no thoughts of harming self or others and could contract fully for safety.    Bedside Procedures    No orders found  [facility] Withdrawal Assessment of Alcohol, Revised  Heart Rate: 72 (11/27/21 0610)  BP: 113/69 (11/27/21 0610)    AUDIT Tool  #1. How often do you have a drink containing alcohol?: Never  (11/24/21 1544)  #9  Have you or someone else been injured as a result of your drinking?: No (11/24/21 1544)  #10  Has a relative, friend, doctor or health worker been concerned about your drinking or suggested you cut down?: No (11/24/21 1544)    Social History     Social History     Socioeconomic History   ? Marital status: Single     Spouse name: Not on file   ? Number of children: Not on file   ? Years of education: Not on file   ? Highest education level: Not on file   Occupational History   ? Not on file   Tobacco Use   ? Smoking status: Some Days     Packs/day: 0.50     Types: Cigarettes     Last attempt to quit: 05/08/2019     Years since quitting: 2.5   ? Smokeless tobacco: Former   Advertising account planner   ? Vaping Use: Some days   Substance and Sexual Activity   ? Alcohol use: No     Alcohol/week: 0.0 standard drinks   ? Drug use: Not Currently     Frequency: 1.0 times per week     Types: Marijuana, "Crack" cocaine     Comment: last use 7 months ago per pt   ? Sexual activity: Not Currently   Other Topics Concern   ? Not on file   Social History Narrative   ? Not on file     Social Determinants of Health     Financial Resource Strain: Medium Risk   ? Difficulty of Paying Living Expenses: Somewhat hard   Food Insecurity: Not on file   Transportation Needs: No Transportation Needs   ? Lack of Transportation (Medical): No   ? Lack of Transportation (Non-Medical): No   Physical Activity: Not on file   Stress: Stress Concern Present   ? Feeling of Stress : Very much   Social Connections: Not on file   Intimate Partner Violence: Not on file   Housing Stability: Not on file     Access Screening   BH Access Screening  Court Appointed Guardian: No  Outside help or community services at home: Mental Health Services    Condition upon Discharge   BP 113/69 (BP Location: Left arm, Patient Position: Lying)   Pulse 72   Temp 97.6 F (36.4 C) (Oral)   Resp 16   LMP 09/15/2021   SpO2 100%  There is no height or weight on file to  calculate BMI.     General: Casually dressed , well-nourished, well-developed, eye contact is good   Speech: Normal in rate/volume/tone   Language: adequate/intact   Mood: Euthymic and Anxious   Affect: Slightly restricted   Thought Process: rate is within normal limits, content is normal, abstract reasoning & computation is within normal limits   Associations: Linear, logical, and goal directed   Abnormal/Psychotic Thoughts: No evidence of delusions or hallucinations, denies suicidal ideations with No specific plan to harm self, denies homicidal ideations   Orientation to person, place, time: oriented to person, place, time/date, situation, day of week and month of year   Attention Span & Concentration: attention span and concentration were age appropriate   Recent & Remote Memory: adequate/intact  Fund of Knowledge: adequate/intact   Judgment: age appropriate   Insight: age appropriate   Risk Assessment: acute suicide or self-harm risk (Low), acute homicide or violence risk (Low), acute self-care deficit risk (Medium and substance abuse risk; (High )    Post Hospital Care     Other Instructions     Ambulatory referral to Smoking Cessation Program      Reason for referral: tobac use]    Referral Type: Consultation    Evaluate and Return       Contact information for follow-up     Ambulatory referral to Smoking Cessation Program      Next Steps: Follow up    Instructions: tobac use]       Recommendations to physicians: none    Justification for two or more antipsychotic medications Is not being discharged on multiple antipsychotic meds    Time spent in discharge process:  70 minutes      Electronically signed:  Mick Sell, MD  11/27/2021 / 9:12 AM  Electronically signed by Mick Sell, MD at 11/27/2021  9:14 AM EDT

## 2021-11-27 NOTE — Nursing Note (Signed)
Formatting of this note might be different from the original.  Shari Henderson is alert and oriented x 4.  Skin is warm and dry.  Respirations are even and unlabored.  Color is adequate.  VWNL.  Readied for discharge at this time. AVS reviewed with and given to him emphasizing discharge information, follow-up appointments, medications, and prescriptions, including a referral for smoking cessation. Shari Henderson declined medications stating, "I might come back and get them later."  Shari Henderson voiced understanding to these instructions.  All valuables and belongings returned.  Denies any thought of harm to self or others.  Denies pain and discomfort.  Shari Henderson is being accompanied home by her friend in personal vehicle.  Instructed to call 911 or return to nearest ED should need arise. Shari Henderson again voiced understanding to all instructions.  No sign or symptom of acute distress noted. Escorted off unit at approximately [phone] by psych tech.     Shari Henderson declined flu vaccination.   Electronically signed by Ocie Doyne, RN at 11/27/2021 10:39 AM EDT

## 2021-11-27 NOTE — Progress Notes (Signed)
Formatting of this note is different from the original.  Patient Name:  Shari Henderson  Date of Birth:  1970-11-13  MRN:  [id number]  Today's Date:  November 27, 2021    Dae Antonucci is scheduled for discharge on Thursday March 16th. The patient will discharge to home, where she resides with her fiance, Shari Henderson. The method of transportation will be by patient's fiance, Shari Henderson. The patient's follow up appointment will be therapy appointment and MD appointment with [facility]     SW met with patient this morning to assess patient wellbeing and discharge readiness. Patient presented as alert and oriented with calm mood and congruent affect. Patient denies SI/HI/AVH and stated she was ready for discharge. Patient was informed she will need to reach out to [facility] to schedule her follow up appointment and verbalized agreement with her discharge plan.     Appointments which have been scheduled    [facility] [place]  Address: [address] [place], [place], [address]  Phone [phone]  Fax: [phone]  Appointment: You have indicated that you would like to schedule your own appointment. Please do so as soon as possible after your discharge.       Patient will not need assistance with psychiatric medications and will be provided with printed RX.     I have met with Meryl Crutch and reviewed the discharge arrangements. This information has been added to the After Visit Summary. The patient did allow contact with their significant other, Shari Henderson at the following phone number [phone]. SW was unable to contact Mr. Laural Benes, though patient and patient's nurse confirmed Mr. Laural Benes will be providing discharge transportation and has been notified of discharge. Decisions were based on patient preferences, needs, resources and eligibility. The After Visit Summary will be forwarded to the next level of  care, [facility].     Electronically signed.  Garrel Ridgel, MSW  3/16/202311:12 AM  Electronically signed by Garrel Ridgel, MSW at 11/27/2021 11:16 AM EDT

## 2021-11-27 NOTE — Care Plan (Signed)
Formatting of this note might be different from the original.    Problem: Coping - Ineffective, Patient/Family  Goal: Effective coping  Outcome: Progressing    Problem: Discharge Planning  Goal: Knowledge of treatment plan (Why is it important for me to do this?)  Outcome: Progressing    Problem: Discharge Planning  Goal: Knowledge of medication management  Outcome: Progressing    Problem: Pain - Acute  Goal: Reduced pain sensation  Outcome: Not Progressing    Problem: Physical Injury, Risk of - to Self or Others  Goal: Absence of self-harm  Outcome: Progressing    Problem: Physical Injury, Risk of - to Self or Others  Goal: Decrease in suicidal ideations  Outcome: Progressing    Pt alert and oriented. Calm and cooperative during assessment. Complaint in taking all her due medications. Denies SI and or thought of harming self. PRN Motrin 800 mg given for back pain and ambien 10 mg given for difficulty falling asleep. All the above PRN meds given were effective. Associate well with peers.     Electronically signed by Greggory Keen, LPN at 04/54/0981  3:17 AM EDT

## 2021-12-23 NOTE — Progress Notes (Signed)
Formatting of this note is different from the original.  TCM NURSING DOCUMENTATION    TCM Requirements for Post-Discharge Contact Deadlines:   Discharge Date:: 12/21/21  7 calendar days post-discharge:: 12/27/2021  14 calendar days post-discharge:: 01/03/2022      Patient Name: Shari Henderson  Patient DOB: 07/09/1971  Patient Current Location:  Other Unknown    Discharge diagnoses:  Primary discharge diagnosis:: Substance induced mood disorder    MEDICATION REVIEW      APPOINTMENTS      SELF-MANAGEMENT      PATIENT TEACHING        Electronically signed by Viann Fish, RN at 12/23/2021  3:42 PM EDT
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
## 2022-02-07 NOTE — Progress Notes (Signed)
Formatting of this note is different from the original.  TCM NURSING DOCUMENTATION    TCM Requirements for Post-Discharge Contact Deadlines:   Discharge Date:: 02/06/22  7 calendar days post-discharge:: 02/12/2022  14 calendar days post-discharge:: 02/19/2022      Patient Name: Shari Henderson  Patient DOB: Feb 06, 1971  Patient Current Location:  Other unknown    Discharge diagnoses:  Primary discharge diagnosis:: Substance induced mood disorder    MEDICATION REVIEW      APPOINTMENTS      SELF-MANAGEMENT      PATIENT TEACHING      Recent discharge, patient seen by [facility]ICS physician during stay      Electronically signed by Constance Haw, RN at 02/07/2022 11:14 AM EDT
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
## 2022-06-27 ENCOUNTER — Emergency Department (HOSPITAL_BASED_OUTPATIENT_CLINIC_OR_DEPARTMENT_OTHER): Payer: Medicare Other

## 2022-06-27 ENCOUNTER — Encounter (HOSPITAL_BASED_OUTPATIENT_CLINIC_OR_DEPARTMENT_OTHER): Payer: Self-pay | Admitting: Emergency Medicine

## 2022-06-27 ENCOUNTER — Emergency Department (HOSPITAL_BASED_OUTPATIENT_CLINIC_OR_DEPARTMENT_OTHER)
Admission: EM | Admit: 2022-06-27 | Discharge: 2022-06-27 | Disposition: A | Discharge status: Home / Self Care | Payer: Medicare Other | Attending: Emergency Medicine | Admitting: Emergency Medicine

## 2022-06-27 ENCOUNTER — Other Ambulatory Visit: Payer: Self-pay

## 2022-06-27 DIAGNOSIS — S81801S Unspecified open wound, right lower leg, sequela: Secondary | ICD-10-CM | POA: Insufficient documentation

## 2022-06-27 DIAGNOSIS — E119 Type 2 diabetes mellitus without complications: Secondary | ICD-10-CM | POA: Insufficient documentation

## 2022-06-27 DIAGNOSIS — S8991XS Unspecified injury of right lower leg, sequela: Secondary | ICD-10-CM | POA: Diagnosis present

## 2022-06-27 DIAGNOSIS — X58XXXS Exposure to other specified factors, sequela: Secondary | ICD-10-CM | POA: Diagnosis not present

## 2022-06-27 LAB — BASIC METABOLIC PANEL
Anion gap: 7 (ref 5–15)
BUN: 10 mg/dL (ref 6–20)
CO2: 27 mmol/L (ref 22–32)
Calcium: 10.1 mg/dL (ref 8.9–10.3)
Chloride: 104 mmol/L (ref 98–111)
Creatinine, Ser: 0.96 mg/dL (ref 0.44–1.00)
GFR, Estimated: >60 mL/min (ref >60)
Glucose, Bld: 101 mg/dL — ABNORMAL HIGH (ref 70–99)
Potassium: 3.9 mmol/L (ref 3.5–5.1)
Sodium: 138 mmol/L (ref 135–145)

## 2022-06-27 LAB — CBC WITH DIFFERENTIAL/PLATELET
Abs Immature Granulocytes: 0.01 10*3/uL (ref 0.00–0.07)
Basophils Absolute: 0 10*3/uL (ref 0.0–0.1)
Basophils Relative: 0 %
Eosinophils Absolute: 0.1 10*3/uL (ref 0.0–0.5)
Eosinophils Relative: 1 %
HCT: 38.3 % (ref 36.0–46.0)
Hemoglobin: 12.4 g/dL (ref 12.0–15.0)
Immature Granulocytes: 0 %
Lymphocytes Relative: 32 %
Lymphs Abs: 1.7 10*3/uL (ref 0.7–4.0)
MCH: 28.8 pg (ref 26.0–34.0)
MCHC: 32.4 g/dL (ref 30.0–36.0)
MCV: 89.1 fL (ref 80.0–100.0)
Monocytes Absolute: 0.4 10*3/uL (ref 0.1–1.0)
Monocytes Relative: 7 %
Neutro Abs: 3.2 10*3/uL (ref 1.7–7.7)
Neutrophils Relative %: 60 %
Platelets: 313 10*3/uL (ref 150–400)
RBC: 4.3 MIL/uL (ref 3.87–5.11)
RDW: 15.9 % — ABNORMAL HIGH (ref 11.5–15.5)
WBC: 5.4 10*3/uL (ref 4.0–10.5)
nRBC: 0 % (ref 0.0–0.2)

## 2022-06-27 NOTE — Discharge Instructions (Signed)
You were seen in the emergency room today with pain in your right leg.  Your blood work and x-ray are not showing evidence of infection at this time.  Please discuss with your primary care physician your wound and whether or not seeing a wound specialist would be valuable.  I would also like for you to keep your appointment with the pain management team.  Return to the emergency department any new or suddenly worsening symptoms.

## 2022-06-27 NOTE — ED Provider Notes (Signed)
Emergency Department Provider Note   I have reviewed the triage vital signs and the nursing notes.   HISTORY  Chief Complaint Leg Pain   HPI Jillian Gonzales is a 51 y.o. female past history reviewed below including diabetes, chronic DVT, chronic right leg wound presents emergency department with report of increased pain and concern for infection.  No fevers or chills.  She states she was on a course of what she believes was Bactrim which she finished 2 weeks ago and symptoms seem to be improving.  She has had increased pain which is her main concern here but has noticed some drainage.  No fevers.  No numbness/weakness in the leg.  No injury. She has been referred to pain mgmt clinic. Does not follow with wound clinic.   Past Medical History:  Diagnosis Date   Bipolar disorder ([place])    Diabetes mellitus without complication ([place])    DVT (deep venous thrombosis) ([place])     Review of Systems  Constitutional: No fever/chills Cardiovascular: Denies chest pain. Respiratory: Denies shortness of breath. Gastrointestinal: No abdominal pain.  Musculoskeletal: Positive right leg pain.  Skin: Positive chronic wound.   ____________________________________________   PHYSICAL EXAM:  VITAL SIGNS: ED Triage Vitals [06/27/22 0125]  Enc Vitals Group     BP 121/66     Pulse Rate (!) 55     Resp 20     Temp 99 F (37.2 C)     Temp Source Oral     SpO2 100 %     Weight 125 lb 10.6 oz (57 kg)   Constitutional: Alert and oriented. Well appearing and in no acute distress. Eyes: Conjunctivae are normal. Head: Atraumatic. Nose: No congestion/rhinnorhea. Mouth/Throat: Mucous membranes are moist.  Neck: No stridor.  Cardiovascular: Good peripheral circulation. 2+ DP and PT pulses. Respiratory: Normal respiratory effort.  Gastrointestinal: No distention.  Musculoskeletal: No appreciable LE edema, erythema, or active drainage. Chronic appearing linear wound to the lateral leg and circular  anterior wound to the RLE.  Neurologic:  Normal speech and language. No gross focal neurologic deficits are appreciated.  Skin:  Skin is warm, dry and intact. No rash noted.      ____________________________________________   LABS (all labs ordered are listed, but only abnormal results are displayed)  Labs Reviewed  CBC WITH DIFFERENTIAL/PLATELET - Abnormal; Notable for the following components:      Result Value   RDW 15.9 (*)    All other components within normal limits  BASIC METABOLIC PANEL - Abnormal; Notable for the following components:   Glucose, Bld 101 (*)    All other components within normal limits   ____________________________________________  RADIOLOGY  DG Tibia/Fibula Right  Result Date: 06/27/2022 CLINICAL DATA:  Right lower leg pain with chronic open wound from DVT procedure in 2004. EXAM: RIGHT TIBIA AND FIBULA - 2 VIEW COMPARISON:  01/28/2019. FINDINGS: There is no evidence of fracture or other focal bone lesions. Soft tissues are unremarkable. IMPRESSION: Negative. Electronically Signed   By: Brett Fairy M.D.   On: 06/27/2022 02:15    ____________________________________________   PROCEDURES  Procedure(s) performed:   Procedures  None  ____________________________________________   INITIAL IMPRESSION / ASSESSMENT AND PLAN / ED COURSE  Pertinent labs & imaging results that were available during my care of the patient were reviewed by me and considered in my medical decision making (see chart for details).   This patient is Presenting for Evaluation of leg pain, which does require a range of  treatment options, and is a complaint that involves a moderate risk of morbidity and mortality.  The Differential Diagnoses include chronic pain, wound infection, septic joint, fracture, compartment syndrome, fascitis, etc.    I decided to review pertinent External Data, and in summary patient with frequent visits to the [facility] ED system. Last visit for leg  complaint was August of this year with imaging and DVT along with labs at that time. Prior visits with concern for drug-seeking behavior noted in the past.   Clinical Laboratory Tests Ordered, included CBC without leukocytosis. No AKI.   Radiologic Tests Ordered, included tib/fib plain films. I independently interpreted the images and agree with radiology interpretation.   Social Determinants of Health Risk no active smoking.   Medical Decision Making: Summary:  Patient presents to the ED with report of right leg pain. Wounds do not appear acutely infected but will obtain plain films and labs.   Reevaluation with update and discussion with patient. Discussed reassuring exam, labs, and imaging. Considered covering with abx but plan for continued outpatient f/u and mgmt of symptoms at home without clear evidence of infection at this time.   Considered admission but exam and imaging reassuring. Plan for continued outpatient mgmt. Patient in agreement.   Disposition: discharge  ____________________________________________  FINAL CLINICAL IMPRESSION(S) / ED DIAGNOSES  Final diagnoses:  Wound of right lower extremity, sequela    Note:  This document was prepared using Dragon voice recognition software and may include unintentional dictation errors.  Alona Bene, MD, [facility] Emergency Medicine    Karyl Sharrar, Arlyss Repress, MD 06/27/22 [id number]

## 2022-06-27 NOTE — ED Triage Notes (Signed)
Leg pain associated with chronically open wound to RLE from procedure for DVT in 2004. Endorses wound oozing, additional sores on back and RLE, low abd cramping, and pain x 3 days. Denies fever, chills, SOB H/o DM On lovenox

## 2022-10-15 NOTE — Progress Notes (Signed)
Formatting of this note might be different from the original.  CARE COORDINATOR CONTACT WITH PATIENT/CAREGIVER AFTER HOSPITAL DISCHARGE     Unable to contact patient to encourage to schedule hospital follow up appointment with their Non NH PCP.  No voice mail, sent my chart message with coordinator's contact information and availability.  Electronically signed by Isla Pence, LPN at 45/40/9811  9:49 AM EST

## 2022-12-22 ENCOUNTER — Inpatient Hospital Stay
Admit: 2022-12-22 | Discharge: 2022-12-22 | Disposition: A | Discharge status: Home / Self Care | Payer: PRIVATE HEALTH INSURANCE | Attending: Emergency Medicine

## 2022-12-22 DIAGNOSIS — F1123 Opioid dependence with withdrawal: Principal | ICD-10-CM

## 2022-12-22 DIAGNOSIS — F1193 Opioid use, unspecified with withdrawal: Secondary | ICD-10-CM

## 2022-12-22 MED ORDER — METHADONE HCL 10 MG PO TABS
10 | Freq: Once | ORAL | Status: AC
Start: 2022-12-22 — End: 2022-12-22
  Administered 2022-12-22: 20:00:00 80 mg via ORAL

## 2022-12-22 MED FILL — METHADONE HCL 10 MG PO TABS: 10 MG | ORAL | Qty: 8

## 2022-12-22 NOTE — ED Provider Notes (Signed)
Triage Chief Complaint:   Withdrawal (Pt to ED via private auto per family.  Pt was discharged from a hospital in [place] 2 days ago.  Pt was supposed to go to a Methadone clinic in [place] but they were unable to see her because she does not have her ID.  Pt states last dose of Methadone was 2 days ago.  Pt states she's experiencing fatigue, diarrhea, and cold sweats.  Pt was advised to go to the ED.  Pt is also being treated for a DVT in right leg.  Pt alert, oriented x 4.  RR Even unlabored.  Skin pink/warm/dry.  Family at bedside.)    HOPI:  Shari Henderson is a 52 y.o. female that presents to the ED with the hope that she can get a dose of methadone.  She was discharged from [facility] on Saturday took a dose last then.  She was initially on 60 mg with the paperwork states and states now it is increased to 80 mg she has been off for 2 days she has complained of fatigue rare cold sweats.  She was told to go to the ED she is trying to follow-up with her family doctor in [place] but has not yet been seen currently taking Eliquis for DVT of the right leg she denies any current pain in that region.  She is not hallucinating not suicidal    Patient really recently hospitalized in a psychiatric unit but she was snorting fentanyl just off for over a week to 2 weeks    Past Medical History:   Diagnosis Date    Bipolar 1 disorder (HCC)     Diabetes mellitus (HCC)     DVT (deep venous thrombosis) (HCC)     Schizo affective schizophrenia (HCC)      Past Surgical History:   Procedure Laterality Date    VEIN SURGERY Right     vein and artery reconstruction in right leg     History reviewed. No pertinent family history.  Social History     Socioeconomic History    Marital status: Single     Spouse name: Not on file    Number of children: Not on file    Years of education: Not on file    Highest education level: Not on file   Occupational History    Not on file   Tobacco Use    Smoking status: Every Day     Current packs/day:  0.50     Types: Cigarettes    Smokeless tobacco: Never   Vaping Use    Vaping Use: Never used   Substance and Sexual Activity    Alcohol use: Not Currently    Drug use: Not Currently     Types: Opiates     Sexual activity: Not on file   Other Topics Concern    Not on file   Social History Narrative    Not on file     Social Determinants of Health     Financial Resource Strain: Not on file   Food Insecurity: Not on file   Transportation Needs: Not on file   Physical Activity: Not on file   Stress: Not on file   Social Connections: Not on file   Intimate Partner Violence: Not on file   Housing Stability: Not on file     No current facility-administered medications for this encounter.     Current Outpatient Medications   Medication Sig Dispense Refill    albuterol sulfate  HFA (VENTOLIN HFA) 108 (90 Base) MCG/ACT inhaler Inhale 2 puffs into the lungs every 6 hours as needed for Wheezing      apixaban (ELIQUIS) 5 MG TABS tablet Take 1 tablet by mouth 2 times daily      atropine 1 % ophthalmic solution Place 1 drop into both eyes in the morning and at bedtime      hydrOXYzine HCl (ATARAX) 50 MG tablet Take 1 tablet by mouth every 8 hours as needed for Anxiety      lidocaine (HM LIDOCAINE PATCH) 4 % external patch Place 1 patch onto the skin daily      methadone (DOLOPHINE) 10 MG/ML solution Take 6 mLs by mouth every 4 hours as needed for Pain. Max Daily Amount: 360 mg      prazosin (MINIPRESS) 2 MG capsule Take 1 capsule by mouth nightly      prednisoLONE acetate (PRED FORTE) 1 % ophthalmic suspension Place 1 drop into both eyes 4 times daily 1 drop left eye 4 times a day, 1 drop right eye 1 time a day.      propranolol (INDERAL) 20 MG tablet Take 1 tablet by mouth 2 times daily as needed (anxiety)      tiotropium (SPIRIVA) 18 MCG inhalation capsule Inhale 1 capsule into the lungs daily      triamcinolone (KENALOG) 0.1 % cream Apply topically 2 times daily Apply to wound one time a day      venlafaxine (EFFEXOR XR) 75 MG  extended release capsule Take 1 capsule by mouth daily       Allergies   Allergen Reactions    Tylenol [Acetaminophen]      Liver failure         ROS:    Review of Systems   Gastrointestinal:  Positive for nausea.   Psychiatric/Behavioral:  Negative for dysphoric mood. The patient is nervous/anxious.    All other systems reviewed and are negative.      Nursing Notes Reviewed    Physical Exam:    BP (!) 134/58   Pulse 62   Temp 97.7 F (36.5 C) (Oral)   Resp 18   Ht 1.676 m (5\' 6" )   Wt 54.9 kg (121 lb)   SpO2 98%   BMI 19.53 kg/m      ED Triage Vitals [12/22/22 1520]   Enc Vitals Group      BP (!) 134/58      Pulse 62      Respirations 18      Temp 97.7 F (36.5 C)      Temp Source Oral      SpO2 98 %      Weight - Scale 54.9 kg (121 lb)      Height 1.676 m (5\' 6" )      Head Circumference       Peak Flow       Pain Score       Pain Loc       Pain Edu?       Excl. in GC?        Physical Exam  Vitals and nursing note reviewed. Exam conducted with a chaperone present.   Constitutional:       Appearance: She is well-developed.   HENT:      Head: Normocephalic and atraumatic.      Right Ear: External ear normal.      Left Ear: External ear normal.      Mouth/Throat:  Mouth: Mucous membranes are moist.      Pharynx: No posterior oropharyngeal erythema.   Eyes:      General: No scleral icterus.        Right eye: No discharge.         Left eye: No discharge.      Conjunctiva/sclera: Conjunctivae normal.      Pupils: Pupils are equal, round, and reactive to light.   Neck:      Thyroid: No thyromegaly.      Vascular: No JVD.      Trachea: No tracheal deviation.   Cardiovascular:      Rate and Rhythm: Normal rate and regular rhythm.      Pulses: Normal pulses.      Heart sounds: Normal heart sounds. No murmur heard.     No friction rub. No gallop.   Pulmonary:      Effort: Pulmonary effort is normal. No respiratory distress.      Breath sounds: Normal breath sounds. No stridor. No wheezing or rales.   Chest:       Chest wall: No tenderness.   Abdominal:      General: Bowel sounds are normal. There is no distension.      Palpations: Abdomen is soft. There is no mass.      Tenderness: There is no abdominal tenderness. There is no right CVA tenderness, left CVA tenderness, guarding or rebound.      Hernia: No hernia is present.   Musculoskeletal:         General: No tenderness or deformity. Normal range of motion.      Cervical back: Normal range of motion and neck supple.      Right lower leg: Edema present.      Left lower leg: Edema present.   Lymphadenopathy:      Cervical: No cervical adenopathy.   Skin:     General: Skin is warm and dry.      Coloration: Skin is not pale.      Findings: Lesion present. No erythema or rash.      Comments: Multiple lesions right and left upper extremity consistent with the patient describes as syphilis that she was treated   Neurological:      Mental Status: She is alert and oriented to person, place, and time.      Cranial Nerves: No cranial nerve deficit.      Sensory: No sensory deficit.      Deep Tendon Reflexes: Reflexes are normal and symmetric. Reflexes normal.   Psychiatric:         Speech: Speech normal.         Behavior: Behavior normal.         Thought Content: Thought content normal.         Judgment: Judgment normal.         I have reviewed and interpreted all of the currently available lab results from this visit (ifapplicable):  No results found for this visit on 12/22/22.   Radiographs (if obtained):  []  The following radiograph wasinterpreted by myself in the absence of a radiologist:   []  Radiologist's Report Reviewed:  No orders to display         EKG (if obtained): (All EKG's are interpreted by myself in the absence of a cardiologist)    Chart review shows recent radiographs:  No results found.    MDM:      Patient did receive 1 dose of 80 mg of methadone  I informed her future ED visits we would not be able to provide this.  [place] and  [facility] healthcare [facility]    Please note that portions of this note may have been completed with a voice recognition/dictation program. Efforts were made to edit the dictations but occasionally words are mis-transcribed.      All pertinent Lab data and radiographic results reviewed with patient at bedside. The patient and/or the family were informed of the results of any tests/labs/imaging, the treatment plan, and time was allotted to answer questions. See chart for details of medications given during the ED stay.     The likelihood of other entities in the differential is insufficient to justify any further testing for them. This was explained to the patient. The patient was advised that persistent or worsening symptoms would require further evaluation.                ED Course and Summary:     History from : Patient    Limitations to history : None    Patient was given the following medications:  Medications   methadone (DOLOPHINE) tablet 80 mg (80 mg Oral Given 12/22/22 1555)       Imaging Interpretation by NA      Chronic conditions affecting care: Fentanyl abuse    Discussion with Other Profesionals : None    Social Determinants : None    Records Reviewed : Source EPIC     Disposition Considerations:   Appropriate for outpatient management      I am the Primary Clinician of Record.               Clinical Impression:  1. Opiate withdrawal (HCC)    2. Fentanyl dependence (HCC)      Disposition referral (if applicable):  [facility]   [address]  [place] [id number]  [phone]  Schedule an appointment as soon as possible for a visit   If symptoms worsen    Disposition medications (if applicable):  Discharge Medication List as of 12/22/2022  3:53 PM              Renae FicklePaul A. Nikko Quast, DO, FACEP      Comment: Please note this report has been produced using speech recognition software and maycontain errors related to that system  including errors in grammar, punctuation, and spelling, as well as words and phrases that may be inappropriate. If there are any questions or concerns please feel free to contact thedictating provider for clarification.        Avelino LeedsWillette, Burak Zerbe, DO  12/22/22 1826

## 2022-12-22 NOTE — Discharge Instructions (Signed)
Please go to [email or web address] or call [phone] to find a new provider.     Or use QR code below:          Resources for Assistance with Substance Abuse      [facility]  [address] [place] [address]  [phone]   [email or web address]    Provides comprehensive treatment for alcohol and drug abuse and dependency, gender specific treatment for women and their children, and medication assisted treatment as well as case management services.  Offers both intensive and non-intensive outpatient services and ambulatory detoxification for men and women, as well as non-medical residential treatment for men.  Walk-in appointments are offered Monday through Thursday, starting at 11:00am on a first come-first serve basis.   For all appointments - please be sure to bring proof of residency,   identification, proof of income (if applicable) and insurance card (if   applicable). (Examples include: State ID or Driver's License with current  address or utility bill with name and current address)  Services are covered by Medicaid and many other insurance plans. Special assistance may be available for those who qualify.  Locations:    Outpatient services for men @  [address], [place], [place]     Residential services for men @ [address], [place], [place]    Outpatient services for women @ [address], [place], [place]    Two additional transitional housing programs for women and children    Assessments   Assessments are conducted to gather the necessary information to provide customers with the most beneficial services plan available.  They can take up to two hours. Upon completion, the customer and the therapist will develop a treatment plan that meets the customer's needs.    Chemical Dependency Education  Designed for individuals who have had consequences related to their drug use, but do not have a dependency diagnosis. The program meets once a week for two hours for four weeks. It  teaches about the emotional, physical and mental aspects related substance use and abuse. It also provides information and techniques to improve coping skills.     Outpatient Treatment  Consists of a combination of services that total less than 9 hours per week.  This program is typically designed for individuals who have had previous treatment and do not need intensive services and structure to assist with maintaining abstinence.    Intensive Outpatient Treatment   Consists of 9 hours of services or more on a weekly basis designed to provide structure and in-depth counseling and information to help clients stabilize in an outpatient setting.    Family Dependency Drug Court  [facility] in conjunction with Juvenile Court and Family and Children's Services operate a Family Dependency Drug Court Program for clients who are at risk of losing custody of their children due to their substance use.    Substance Abuse Violence Intervention  Offers an intensive program designed specifically for domestic violence offenders with substance abuse histories.  The [facility] project's primary goal is to reduce the incidence of domestic violence by working hand-in-hand with community social service agencies, law enforcement, domestic violence offenders, and when appropriate, their families. The treatment is certified through [place] [facility]    Criminal Justice Programs  [facility] has a jail therapist and case manager located in the [place] [facility].  Judges and probation officers rely on the therapist to accurately assess and make recommendations for treatment for those individuals who are   incarcerated in the jail.  Referrals are often made directly from jail to residential treatment for both men and women.  [facility] also has one therapist working within the [place] [facility] program. Substance Abuse treatment services are provided for ex-offenders returning to [place] to assist with  facilitating a successful transition back into the community.    In addition to the services listed above, [facility] offers transitional housing for women and their children. For more information regarding transitional housing call [phone] ext. [phone].        [facility]    Offers treatment for alcohol, drug, and tobacco addiction                      [facility] [place] ([facility])  [facility]  [address] [place] [address]  http://www.foafamilies.org  [email or web address]@gmail.com   [phone]    A non-profit organization in [place] that educates, empowers and embraces families, friends and individuals struggling with addiction to rebuild families and transform lives.   Works to reduce the stigma of addiction, ensure availability of adequate treatment and recovery support services, as well as to influence public opinion and policy regarding the value of recovery.   Holds weekly support meetings where families and individuals affected by addiction can come for support, friendship and education. The sharing of our experiences, strength and hope offer a pathway to peace.  [place] [facility] meets weekly on Tuesday evenings (6:30 to 8 pm) at [address].        [facility]  [address]  [address]  [place] [address]  [phone]    A transitional living center for men in early stages of substance abuse recovery.    Purpose is to provide safe clean housing in a sober environment for men committed to their recovery.   Assists residents in finding employment when necessary.        [facility] [place]  Provides a comprehensive array of mental health counseling and psychiatric services to adults, youth and children, including inpatient hospitalization.  Limited primary health care is available to eligible clients.   Provides alcohol and drug treatment services for  adolescents.    Adult Services  [address] [place] [address]  [phone]  www.[facility].org    Behavioral Health   Rehabilitation Programs  [address] [place] [address]  [phone]    Children & Adolescents  [address] [place] [address]  [phone]    [facility]  [address] [place] [address]  [phone]        For additional information and referrals, dial 2-1-1.  Residents can obtain information at 2-1-1 about hospitals, doctors, the nearest food pantry, utility services, or a variety of other types of resources. The number is available for non-emergency assistance 24 hours a day.  Alternate Numbers:    [place]:  [phone]    [place]:  [phone]    [place]:  [phone]    Toll-Free:  [phone]

## 2022-12-23 ENCOUNTER — Inpatient Hospital Stay
Admit: 2022-12-23 | Discharge: 2022-12-23 | Disposition: A | Discharge status: Home / Self Care | Payer: PRIVATE HEALTH INSURANCE

## 2022-12-23 DIAGNOSIS — F1193 Opioid use, unspecified with withdrawal: Secondary | ICD-10-CM

## 2022-12-23 DIAGNOSIS — F1123 Opioid dependence with withdrawal: Principal | ICD-10-CM

## 2022-12-23 MED ORDER — METHADONE HCL 10 MG PO TABS
10 | Freq: Once | ORAL | Status: AC
Start: 2022-12-23 — End: 2022-12-23
  Administered 2022-12-23: 80 mg via ORAL

## 2022-12-23 MED FILL — METHADONE HCL 10 MG PO TABS: 10 MG | ORAL | Qty: 8

## 2022-12-23 NOTE — ED Provider Notes (Signed)
**ADVANCED PRACTICE PROVIDER, I HAVE EVALUATED THIS PATIENT**        [facility] EMERGENCY DEPARTMENT  EMERGENCY DEPARTMENT ENCOUNTER      Pt Name: Shari Henderson  RRN:[id number]  Birthdate 04/17/1971  Date of evaluation: 12/23/2022  Provider: Justine Null, PA-C      Chief Complaint:    Chief Complaint   Patient presents with    Medication Refill     Wants prescription for methadone. Was told to come here from [place] ER.          Nursing Notes, Past Medical Hx, Past Surgical Hx, Social Hx, Allergies, and Family Hx were all reviewed and agreed with or any disagreements were addressed in the HPI.    HISTORY OF PRESENT ILLNESS     History from : Patient and family step mother     Limitations to history : None    Shari Henderson is a 52 y.o. female who presents with a request for a methadone dose.  She states she is continuing concern for opiate withdrawal mention that she has had some chills, some loose stools.   she mentions a history of fentanyl abuse, and recently was released from inpatient facility in [place], where she was started on methadone.  She and her stepmother mentions that they were advised to return to this emergency department for dose of methadone.  She relocated here and plans to follow-up with primary care provider.  Her stepmother is with her and mentions that they already have an appointment scheduled with Dr. Roger Shelter but appointment is pending the activation of her insurance.  Additionally also mention they have an appointment scheduled with [facility] treatment services tomorrow morning.  She mentions that she had been on Suboxone in the past but did not tolerate it well and had to transition to methadone.  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       Medications:  Discharge Medication List as of 12/23/2022  7:30 PM        CONTINUE these medications which have NOT CHANGED    Details   albuterol sulfate HFA (VENTOLIN HFA) 108 (90 Base) MCG/ACT inhaler Inhale 2 puffs into the lungs every 6 hours as needed for WheezingHistorical Med      apixaban (ELIQUIS) 5 MG TABS tablet Take 1 tablet by mouth 2 times dailyHistorical Med      atropine 1 % ophthalmic solution Place 1 drop into both eyes in the morning and at bedtimeHistorical Med      hydrOXYzine HCl (ATARAX) 50 MG tablet Take 1 tablet by mouth every 8 hours as needed for AnxietyHistorical Med      lidocaine (HM LIDOCAINE PATCH) 4 % external patch Place 1 patch onto the skin daily, TransDERmal, DAILY, Historical Med      methadone (DOLOPHINE) 10 MG/ML solution Take 6 mLs by mouth every 4 hours as needed for Pain. Max Daily Amount: 360 mgHistorical Med      prazosin (MINIPRESS) 2 MG capsule Take 1 capsule  by mouth nightlyHistorical Med      prednisoLONE acetate (PRED FORTE) 1 % ophthalmic suspension Place 1 drop into both eyes 4 times daily 1 drop left eye 4 times a day, 1 drop right eye 1 time a day.Historical Med      propranolol (INDERAL) 20 MG tablet Take 1 tablet by mouth 2 times daily as needed (anxiety)Historical Med      tiotropium (SPIRIVA) 18 MCG inhalation capsule Inhale 1 capsule into the lungs dailyHistorical Med      triamcinolone (KENALOG) 0.1 % cream Apply topically 2 times daily Apply to wound one time a day, Topical, 2 TIMES DAILY, Historical Med      venlafaxine (EFFEXOR XR) 75 MG extended release capsule Take 1 capsule by mouth dailyHistorical Med               Review of Systems:  (1 systems needed)  Pertinent positives and negatives are stated within HPI, all other systems reviewed and are negative.  Review of Systems   Neurological:  Negative for headaches.    Psychiatric/Behavioral:  Negative for confusion and suicidal ideas.            Physical Exam:  Physical Exam  Constitutional:       General: alert and oriented, well appearing, no acute distress, Well-developed. Not toxic-appearing.   HENT:      Head: Normocephalic and atraumatic.     ENT: mucous membranes moist, voice normal in character, nose normal in appearance, no rhinorrhea, oropharynx normal in appearance   Eyes:      EOM intact, no conjunctival injection  Neck: supple, normal ROM without stiffness   Cardiovascular:      Regular rate  Pulmonary:      Effort: No respiratory distress     Breath sounds: Normal breath sounds. No stridor. No wheezing or rhonchi.   Abdominal:      General: No evidence of abdominal discomfort or peritoneal signs; Absent: guarding, rebound, distention  Musculoskeletal:         General: Grossly unremarkable on simple inspection; no deformities or decreased ROM; Absent: BLE edema  Skin:     General: warm, dry, intact, normal color for age and race; no concerning rashes or lesions  Neurological:      Alert, moves all extremities, no focal deficits  Psych: Normal mood and affect; appropriate for situation,  no evidence of disorganized thoughts or confusion       ED COURSE / RESULTS     Pt is a 52 y.o. female who presents with above HPI. Nursing notes and vital signs reviewed.      Vitals:    12/23/22 1653 12/23/22 1711 12/23/22 1933   BP:  133/85 (!) 140/93   Pulse:  69 63   Resp:  16 16   Temp:  97.5 F (36.4 C) 98.8 F (37.1 C)   TempSrc:  Oral Oral   SpO2:  100% 99%   Weight: 54.9 kg (121 lb) 54.9 kg (121 lb)    Height: 1.676 m (5\' 6" ) 1.676 m (5\' 6" )              LABS:Labs Reviewed - No data to display     Remainder of labs reviewed and were negative at this time or not returned at the time of this note.    RADIOLOGY:   Non-plain film images such as CT, Ultrasound and MRI are read by the radiologist. Annett Fabian, PA-C have directly visualized the radiologic plain film  image(s) with the below findings:    Interpretation per the Radiologist below, if available at the time of this note:    No orders to display        No results found.       PROCEDURES:   Procedures      Patient was given thefollowing medications:  Medications   methadone (DOLOPHINE) tablet 80 mg (80 mg Oral Given 12/23/22 1936)           HPI SUMMARY, DDX,  REASSESSMENT, MEDICAL DECISION MAKING :       Patient initially presented with Medication Refill (Wants prescription for methadone. Was told to come here from [facility] ER. ) Patient with history, exam findings, and diagnostic results as above . Patient was nontoxic, stable. When ordered, EKG's are interpreted by the Emergency Department Physician in the absence of a cardiologist.  Please see their note for interpretation of EKG. When ordered, lab work results as above.  I did review nursing notes, vital signs, and if any pertinent and available external records.           Differential diagnosis/MDM:  Patient has a history of DVT currently on anticoagulation denies any chest pain or shortness of breath or extremity issues today.  She does have a psychiatric history with documented history of bipolar and schizo affective disorder however on my examination she is alert oriented no acute distress no disorganized thoughts or confusion.  Denies any SI or HI.  She is very pleasant, answering questions appropriately.  Is not agitated.  Not seeing any concern for paranoia or acute psychosis and patient is accompanied by her stepmother who is assisted with history and patient's care as patient is visually impaired.  Not seeing any concerning signs that would warrant a psychiatric evaluation or workup in The emergency department.     She is describing some mild nausea, diarrhea, restlessness, this, and craving.  Does have a history of fentanyl abuse.  Potential for withdrawal today.  She does show me paperwork from her outside facility where she was started on methadone at  .  She was seen yesterday at [facility] ED and was given a one-time dose of  methadone there.  Today I have no concern for an acute overdose.  Feel would be reasonable to give her a one-time dose of methadone here as she is at risk for continued withdrawal and has started on a methadone regimen previously.      Condition is: mild stable chronic illness    Disposition Considerations (tests considered but not done, Shared Decision Making, Pt Expectation of Test or Tx.): none (unless indicated in ED Course, Summary, Reassessment, or MDM    Discussion with Other Professionals : As indicated in SUMMARY, ED COURSE AND REASSESSMENT, MDM    External Records Reviewed : External ED Note emergency [facility], patient received 1 dose of methadone    Escalation of care, including admission/OBS considered : Appropriate for outpatient management.     Chronic conditions affecting care:    has a past medical history of Bipolar 1 disorder (HCC), Diabetes mellitus (HCC), DVT (deep venous thrombosis) (HCC), and Schizo affective schizophrenia (HCC).    Social Determinants Significantly Affecting  Health:  Social Determinants of Health : None     Disposition: Discussed need to follow up diagnostics, including incidental findings. Discharged with instructions to obtain outpatient follow up of patient's symptoms and findings, with strict return precautions if patient develops new or worsening symptoms.  Follow-up plan and return precautions  were provided and discussed in detail patient in agreement.    I am the Primary Clinician of Record.      Disposition: Discussed need to follow up diagnostics, including incidental findings. Discharged with instructions to obtain outpatient follow up of patient's symptoms and findings, with strict return precautions if patient develops new or worsening symptoms.  Follow-up plan and return precautions were provided and discussed in detail patient in agreement.    I am the Primary Clinician of  Record.    This patient was evaluated, managed, and treated in the context of the COVID-19 pandemic. As such, associated resources were utilized in his care. I did don/doff appropriate PPE (including N95 mask, safety glasses, gown, gloves), as recommended by the health facility/national standard best practice, during my bedside interactions with the patient.      The patient tolerated their visit well.  I evaluated the patient.  The physician was available for consultation as needed.  The patient and / or the family were informed of the results of any tests, a time was given to answer questions, a plan was proposed and they agreed with plan.       CLINICAL IMPRESSION:  1. Opiate withdrawal (HCC)    2. History of opioid abuse (HCC)        Is this patient to be included in the SEP-1 Core Measure due to severe sepsis or septic shock?   No   Exclusion criteria - the patient is NOT to be included for SEP-1 Core Measure due to:  2+ SIRS criteria are not met    DISPOSITION Decision To Discharge 12/23/2022 07:41:24 PM      PATIENT REFERRED TO:  Mart Piggs, PA-C  [address] [place] [address]  [phone]      For reevaluation    Leodis Sias, MD  [address]  [place] [address]  [phone]            DISCHARGE MEDICATIONS:  Discharge Medication List as of 12/23/2022  7:30 PM          DISCONTINUED MEDICATIONS:  Discharge Medication List as of 12/23/2022  7:30 PM                 (Please note the MDM and HPI sections of this note were completed with a voice recognition program.  Efforts were made to edit the dictations but occasionally words are mis-transcribed.)    Electronically signed, Justine Null, PA-C,          Justine Null, PA-C  12/23/22 2254

## 2022-12-23 NOTE — Discharge Instructions (Addendum)
Continuity of Care Form    Patient Name: Shari Henderson   DOB:  07-Jan-1971  MRN:  [id number]    Admit date:  12/23/2022  Discharge date:  ***    Code Status Order: No Order   Advance Directives:     Admitting Physician:  No admitting provider for patient encounter.  PCP: No primary care provider on file.    Discharging Nurse: [facility] Unit/Room#: ED06/ED-06  Discharging Unit Phone Number: ***    Emergency Contact:   No emergency contact information on file.    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    Active Problems:  There is no problem list on file for this patient.      Isolation/Infection:   Isolation            No Isolation          Patient Infection Status       None to display            Nurse Assessment:  Last Vital Signs: BP (!) 140/93   Pulse 63   Temp 98.8 F (37.1 C) (Oral)   Resp 16   Ht 1.676 m ( )   Wt 54.9 kg (121 lb)   SpO2 99%   BMI 19.53 kg/m     Last documented pain score (0-10 scale): Pain Level: 9  Last Weight:   Wt Readings from Last 1 Encounters:   12/23/22 54.9 kg (121 lb)     Mental Status:  {IP PT MENTAL STATUS:20030}    IV Access:  {MH COC IV ACCESS:304088262}    Nursing Mobility/ADLs:  Walking   {CHP DME NWGN:562130865}  Transfer  {CHP DME HQIO:962952841}  Bathing  {CHP DME LKGM:010272536}  Dressing  {CHP DME UYQI:347425956}  Toileting  {CHP DME LOVF:643329518}  Feeding  {CHP DME ACZY:606301601}  Med Admin  {CHP DME UXNA:355732202}  Med Delivery   {MH COC MED Delivery:304088264}    Wound Care Documentation and Therapy:        Elimination:  Continence:   Bowel: {YES / RK:27062}  Bladder: {YES / BJ:62831}  Urinary Catheter: {Urinary Catheter:304088013}   Colostomy/Ileostomy/Ileal Conduit: {YES / DV:76160}       Date of Last BM: ***  No intake or output data in the 24  hours ending 12/23/22 1941  No intake/output data recorded.    Safety Concerns:     {MH COC Safety Concerns:304088272}    Impairments/Disabilities:      {MH COC Impairments/Disabilities:304088273}    Nutrition Therapy:  Current Nutrition Therapy:   {MH COC Diet List:304088271}    Routes of Feeding: {CHP DME Other Feedings:304088042}  Liquids: {Slp liquid thickness:30034}  Daily Fluid Restriction: {CHP DME Yes amt example:304088041}  Last Modified Barium Swallow with Video (Video Swallowing Test): {Done Not Done VPXT:062694854}    Treatments at the Time of Hospital Discharge:   Respiratory Treatments: ***  Oxygen Therapy:  {Therapy; copd oxygen:17808}  Ventilator:    {MH CC Vent OEVO:350093818}    Rehab Therapies: {THERAPEUTIC INTERVENTION:[phone]}  Weight Bearing Status/Restrictions: {MH CC Weight Bearing:304508812}  Other Medical Equipment (for information only, NOT a DME order):  {EQUIPMENT:304520077}  Other Treatments: ***    Patient's personal belongings (please select all that are  sent with patient):  {CHP DME Belongings:304088044}    RN SIGNATURE:  {Esignature:304088025}    CASE MANAGEMENT/SOCIAL WORK SECTION    Inpatient Status Date: ***    Readmission Risk Assessment Score:  Readmission Risk              Risk of Unplanned Readmission:  0           Discharging to Facility/ Agency   Name:   Address:  Phone:  Fax:    Dialysis Facility (if applicable)   Name:  Address:  Dialysis Schedule:  Phone:  Fax:    Case Manager/Social Worker signature: {Esignature:304088025}    PHYSICIAN SECTION    Prognosis: {Prognosis:[phone]}    Condition at Discharge: {MH Patient Condition:304088024}    Rehab Potential (if transferring to Rehab): {Prognosis:[phone]}    Recommended Labs or Other Treatments After Discharge: ***    Physician Certification: I certify the above information and transfer of Shari Henderson  is necessary for the continuing treatment of the diagnosis listed and that she requires {Admit to Appropriate  Level of Care:20763} for {GREATER/LESS:304500278} 30 days.     Update Admission H&P: {CHP DME Changes in XFGHW:299371696}    PHYSICIAN SIGNATURE:  {Esignature:304088025}

## 2022-12-23 NOTE — ED Triage Notes (Signed)
Patient triage with c/o needing methadone refill. Patient was seen at [facility] ER last night and told to come here today for medication. Resps even and unlabored.

## 2022-12-23 NOTE — ED Notes (Signed)
Pharmacy called and made aware of needing meds sent as we do not have amt in stock.

## 2022-12-23 NOTE — Discharge Instructions (Addendum)
Keep your appointment scheduled with [facility] treatment services tomorrow.    Contact a primary care provider to get established for ongoing care, reevaluation of your symptoms today, as well as your eye symptoms.          [employer]  [address] [place], [place]   [phone]    Addiction Support and Treatment Options Near [facility] REACH  Outpatient treatment for both men & women  [address] [place] [address]  [phone]    [address]   [place] [address]  [phone]    [address],   [place] [address]  [phone]  Intensive Outpatient and Residential    [facility]        [address] [place], [address]  [phone]    [facility]   (various other locations in [place]/ visit [email or web address])  [address], [place] [address]  [phone]    [facility]   [address], ,  [place], [address]   [phone]   [phone]    [facility]   [address]  [place], [place] [address]  [phone]      [facility]   [address]  [place], [place] [address]  [phone]    [facility]  [address]  [place], [place] [address]  [phone]    [facility]:   [address]  [place], [place] [address]  [phone]    [facility]  [address] [place], [place] [address]  [phone]  [email or web address]    [address] [place], [place] [address]  [phone]  Fax [phone]    [address]  [address]  [place], [place] [address]  [phone]  Fax [phone]    [address]  [place], [place] [address]  [phone]  Fax [phone]     [facility]  [address]  [place], [place] [address]  phone: [phone]       [facility] - Primary Care needs    Office Hours: Monday-Friday 8 a.m. to 8 p.m. Saturday 9 a.m. to 1 p.m.   [address]    [place], [place] [address]   Phone: [phone]   [facility] Physician Finder/ Scheduling   [phone]   Health Centers   [facility] Primary Care at [facility]   [phone]   [address] [place] Building Room 30   (All insurances or no insurance with sliding scale payment)   [facility]   [phone]   [address] [facility])   (All insurances or no insurance with sliding scale payment)   [facility]    [phone]   [address]    (All insurances or no insurance with sliding scale payment)   These practices have appointments for hospital follow up. Also schedule a new patient appointment with Primary Care Provider of your choice. It normally takes a few weeks to get an appointment with a new provider, so call TODAY.             Primary Care Physicians   Primary Care Physicians   [facility]   [address]  [place], [place] [address]   [phone]     [facility]   [address]   [place], [place] [address]   [phone]       [facility]   [address] [place], [place] [address]   [phone]     [facility]   [address]   [place], [place] [address]   [phone]       [facility] [place], [place] [address]   [phone]     [facility] Pediatric[facility]   [address]   [place], [place] [address]   Pediatrics: [phone]   Family: [phone]       [facility]   [address] [place]   [place], [place] [address]   [phone]     [facility]   [address] [place]   [place], [place] [address]   [phone]       [facility]   [address]   [place], [place] [address]   [phone]     [facility]    [address]    [place], [place] [address]    [phone]       [facility] - Mental Health Serivces also   [address]   [place], [place] [address]   [phone]     [facility]    [address]   [place], [place] [address]   [phone]   (As of October 29, 2020, they are scheduling at least six months out.)

## 2022-12-23 NOTE — Care Coordination-Inpatient (Signed)
CM consulted for PCP resources and methadone clinic referrals. CM reviewed chart and noted pt was seen in [place] yesterday and given a does of methadone. Pt is from [place] and in Dr. Georgian Co note pt went to a clinic and was turned away because she did not have id. CM added PCP resources and clinics to AVS. Pt will have to get id and follow with the outside clinics for methadone treatment.

## 2022-12-23 NOTE — ED Notes (Signed)
All discharge instructions gone over and all questions answered. Pt ambulated to the front of the ED with a friend.

## 2023-01-01 ENCOUNTER — Inpatient Hospital Stay
Admit: 2023-01-01 | Discharge: 2023-01-02 | Disposition: A | Discharge status: Home / Self Care | Payer: PRIVATE HEALTH INSURANCE | Attending: Emergency Medicine

## 2023-01-01 ENCOUNTER — Emergency Department: Admit: 2023-01-01 | Payer: PRIVATE HEALTH INSURANCE

## 2023-01-01 DIAGNOSIS — Z8701 Personal history of pneumonia (recurrent): Secondary | ICD-10-CM

## 2023-01-01 DIAGNOSIS — K625 Hemorrhage of anus and rectum: Secondary | ICD-10-CM

## 2023-01-01 LAB — CBC WITH AUTO DIFFERENTIAL
Basophils %: 0.4 % (ref 0–1)
Basophils Absolute: 0 10*3/uL
Eosinophils %: 2.3 % (ref 0–3)
Eosinophils Absolute: 0.1 10*3/uL
Hematocrit: 35.2 % — ABNORMAL LOW (ref 37–47)
Hemoglobin: 11.6 GM/DL — ABNORMAL LOW (ref 12.5–16.0)
Immature Neutrophil %: 0.2 % (ref 0–0.43)
Lymphocytes %: 29.8 % (ref 24–44)
Lymphocytes Absolute: 1.6 10*3/uL
MCH: 30.7 PG (ref 27–31)
MCHC: 33 % (ref 32.0–36.0)
MCV: 93.1 FL (ref 78–100)
MPV: 8.5 FL (ref 7.5–11.1)
Monocytes %: 9.2 % — ABNORMAL HIGH (ref 0–4)
Monocytes Absolute: 0.5 10*3/uL
Neutrophils %: 58.1 % (ref 36–66)
Platelets: 297 10*3/uL (ref 140–440)
RBC: 3.78 10*6/uL — ABNORMAL LOW (ref 4.2–5.4)
RDW: 13.5 % (ref 11.7–14.9)
Segs Absolute: 3.1 10*3/uL
Total Immature Neutrophil: 0.01 10*3/uL
WBC: 5.3 10*3/uL (ref 4.0–10.5)

## 2023-01-01 LAB — URINALYSIS
Bilirubin Urine: NEGATIVE MG/DL
Blood, Urine: NEGATIVE
Glucose, Urine: NEGATIVE MG/DL
Ketones, Urine: NEGATIVE MG/DL
Nitrite Urine, Quantitative: NEGATIVE
Protein, UA: NEGATIVE MG/DL
Specific Gravity, UA: 1.015 (ref 1.001–1.035)
Urobilinogen, Urine: 1 MG/DL (ref 0.2–1.0)
pH, Urine: 7.5 (ref 5.0–8.0)

## 2023-01-01 LAB — COMPREHENSIVE METABOLIC PANEL
ALT: 25 U/L (ref 10–40)
AST: 27 IU/L (ref 15–37)
Albumin: 3.9 GM/DL (ref 3.4–5.0)
Alkaline Phosphatase: 111 IU/L (ref 40–129)
Anion Gap: 11 (ref 7–16)
BUN: 6 MG/DL (ref 6–23)
CO2: 27 MMOL/L (ref 21–32)
Calcium: 9.9 MG/DL (ref 8.3–10.6)
Chloride: 101 mMol/L (ref 99–110)
Creatinine: 0.6 MG/DL (ref 0.6–1.1)
Est, Glom Filt Rate: >90 mL/min/{1.73_m2} (ref >60)
Glucose: 109 MG/DL — ABNORMAL HIGH (ref 70–99)
Potassium: 4.1 MMOL/L (ref 3.5–5.1)
Sodium: 139 MMOL/L (ref 135–145)
Total Bilirubin: 0.5 MG/DL (ref 0.0–1.0)
Total Protein: 7.3 GM/DL (ref 6.4–8.2)

## 2023-01-01 LAB — MICROSCOPIC URINALYSIS
Bacteria, UA: NEGATIVE /HPF
Cast Type: NONE SEEN /HPF
Crystal Type: NEGATIVE /HPF
Epithelial Cells, UA: 10 /HPF
RBC, UA: NEGATIVE /HPF (ref 0–6)
WBC, UA: 5 /HPF (ref 0–5)

## 2023-01-01 LAB — PREGNANCY, URINE: Pregnancy, Urine: NEGATIVE

## 2023-01-01 LAB — LIPASE: Lipase: 10 IU/L — ABNORMAL LOW (ref 13–60)

## 2023-01-01 MED ORDER — IOPAMIDOL 76 % IV SOLN
76 | INTRAVENOUS | Status: AC
Start: 2023-01-01 — End: ?

## 2023-01-01 MED ORDER — MORPHINE SULFATE (PF) 4 MG/ML IJ SOLN
4 | Freq: Once | INTRAMUSCULAR | Status: AC
Start: 2023-01-01 — End: 2023-01-01
  Administered 2023-01-01: 23:00:00 4 mg via INTRAVENOUS

## 2023-01-01 MED ORDER — ONDANSETRON HCL 4 MG/2ML IJ SOLN
4 | Freq: Once | INTRAMUSCULAR | Status: AC
Start: 2023-01-01 — End: 2023-01-01
  Administered 2023-01-01: 23:00:00 4 mg via INTRAVENOUS

## 2023-01-01 MED ORDER — IOPAMIDOL 76 % IV SOLN
76 | Freq: Once | INTRAVENOUS | Status: AC | PRN
Start: 2023-01-01 — End: 2023-01-01
  Administered 2023-01-02: 100 mL via INTRAVENOUS

## 2023-01-01 MED FILL — ONDANSETRON HCL 4 MG/2ML IJ SOLN: 4 MG/2ML | INTRAMUSCULAR | Qty: 2

## 2023-01-01 MED FILL — MORPHINE SULFATE 4 MG/ML IJ SOLN: 4 mg/mL | INTRAMUSCULAR | Qty: 1

## 2023-01-01 MED FILL — ISOVUE-370 76 % IV SOLN: 76 % | INTRAVENOUS | Qty: 100

## 2023-01-01 NOTE — ED Provider Notes (Signed)
Rectal Bleeding  Patient reports to the emergency department with a slight exacerbation of her chronic back pain but the patient's primary concern is that she was having some bright red blood in her stool.  Patient has a history of constipation as well as hemorrhoids in the past.  She has not been having any abnormal abdominal pain and she denies any fevers chills nausea vomiting or diarrhea.  Patient was just concerned because the blood in her stool she has had recent hospitalization at outlying facility for pneumonia.  Patient is a new resident within the [place] area and is living with her father.  The patient will be following up with his family physician Dr. Roger Shelter    Review of Systems   Constitutional: Negative.    HENT: Negative.     Eyes: Negative.    Respiratory: Negative.     Cardiovascular: Negative.    Gastrointestinal:  Positive for hematochezia.   Genitourinary: Negative.    Musculoskeletal: Negative.    Skin: Negative.    Neurological: Negative.    All other systems reviewed and are negative.      History reviewed. No pertinent family history.  Social History     Socioeconomic History    Marital status: Single     Spouse name: Not on file    Number of children: Not on file    Years of education: Not on file    Highest education level: Not on file   Occupational History    Not on file   Tobacco Use    Smoking status: Every Day     Current packs/day: 0.50     Types: Cigarettes    Smokeless tobacco: Never   Vaping Use    Vaping Use: Never used   Substance and Sexual Activity    Alcohol use: Not Currently    Drug use: Not Currently     Types: Opiates     Sexual activity: Not on file   Other Topics Concern    Not on file   Social History Narrative    Not on file     Social Determinants of Health     Financial Resource Strain: Not on file   Food Insecurity: Not on file   Transportation Needs: Not on file   Physical Activity: Not on file   Stress: Not on file   Social Connections: Not on file   Intimate  Partner Violence: Not on file   Housing Stability: Not on file     Past Surgical History:   Procedure Laterality Date    VEIN SURGERY Right     vein and artery reconstruction in right leg     Past Medical History:   Diagnosis Date    Bipolar 1 disorder (HCC)     Diabetes mellitus (HCC)     DVT (deep venous thrombosis) (HCC)     Schizo affective schizophrenia (HCC)      Allergies   Allergen Reactions    Tylenol [Acetaminophen]      Liver failure     Prior to Admission medications    Medication Sig Start Date End Date Taking? Authorizing Provider   docusate sodium (COLACE) 100 MG capsule Take 1 capsule by mouth 2 times daily 01/01/23 01/31/23 Yes Tayler Heiden, Crissie Reese, DO   senna (SENOKOT) 8.6 MG TABS tablet Take 1 tablet by mouth daily 01/01/23  Yes Leverne Tessler, Crissie Reese, DO   albuterol sulfate HFA (VENTOLIN HFA) 108 (90 Base) MCG/ACT inhaler Inhale 2 puffs into the  lungs every 6 hours as needed for Wheezing    [provider]   apixaban (ELIQUIS) 5 MG TABS tablet Take 1 tablet by mouth 2 times daily    [provider]   atropine 1 % ophthalmic solution Place 1 drop into both eyes in the morning and at bedtime    [provider]   hydrOXYzine HCl (ATARAX) 50 MG tablet Take 1 tablet by mouth every 8 hours as needed for Anxiety    [provider]   lidocaine (HM LIDOCAINE PATCH) 4 % external patch Place 1 patch onto the skin daily    [provider]   methadone (DOLOPHINE) 10 MG/ML solution Take 6 mLs by mouth every 4 hours as needed for Pain. Max Daily Amount: 360 mg    [provider]   prazosin (MINIPRESS) 2 MG capsule Take 1 capsule by mouth nightly    [provider]   prednisoLONE acetate (PRED FORTE) 1 % ophthalmic suspension Place 1 drop into both eyes 4 times daily 1 drop left eye 4 times a day, 1 drop right eye 1 time a day.    [provider]   propranolol (INDERAL) 20 MG tablet Take 1 tablet by mouth 2 times daily as needed (anxiety)     [provider]   tiotropium (SPIRIVA) 18 MCG inhalation capsule Inhale 1 capsule into the lungs daily    [provider]   triamcinolone (KENALOG) 0.1 % cream Apply topically 2 times daily Apply to wound one time a day    [provider]   venlafaxine (EFFEXOR XR) 75 MG extended release capsule Take 1 capsule by mouth daily    [provider]       BP 115/77   Pulse 66   Temp 97.5 F (36.4 C) (Oral)   Resp 16   Ht 1.676 m ( )   Wt 60 kg (132 lb 3.2 oz)   SpO2 98%   BMI 21.34 kg/m     Physical Exam  Vitals and nursing note reviewed. Exam conducted with a chaperone present.   Constitutional:       General: She is not in acute distress.     Appearance: She is well-developed. She is not toxic-appearing.   HENT:      Head: Normocephalic and atraumatic.      Right Ear: External ear normal.      Left Ear: External ear normal.      Nose: Nose normal.   Eyes:      Conjunctiva/sclera: Conjunctivae normal.      Pupils: Pupils are equal, round, and reactive to light.   Cardiovascular:      Rate and Rhythm: Normal rate and regular rhythm.      Heart sounds: Normal heart sounds.   Pulmonary:      Effort: Pulmonary effort is normal.      Breath sounds: Normal breath sounds.   Abdominal:      General: Bowel sounds are normal.      Palpations: Abdomen is soft.   Genitourinary:     Rectum: Guaiac result positive. Anal fissure and internal hemorrhoid present.      Comments: Rectal Exam performed at bedside, Guiac testing done by myself, results were positive and the quality control was positive.    Musculoskeletal:         General: Normal range of motion.      Cervical back: Normal range of motion and neck supple.   Skin:  General: Skin is warm and dry.   Neurological:      Mental Status: She is alert and oriented to person, place, and time.      GCS: GCS eye subscore is 4. GCS verbal subscore is 5. GCS motor subscore is 6.   Psychiatric:         Behavior: Behavior normal.          Thought Content: Thought content normal.         Judgment: Judgment normal.         MDM:    Labs Reviewed   CBC WITH AUTO DIFFERENTIAL - Abnormal; Notable for the following components:       Result Value    RBC 3.78 (*)     Hemoglobin 11.6 (*)     Hematocrit 35.2 (*)     Monocytes % 9.2 (*)     All other components within normal limits   COMPREHENSIVE METABOLIC PANEL - Abnormal; Notable for the following components:    Glucose 109 (*)     All other components within normal limits   LIPASE - Abnormal; Notable for the following components:    Lipase 10 (*)     All other components within normal limits   URINALYSIS - Abnormal; Notable for the following components:    Leukocyte Esterase, Urine SMALL NUMBER OR AMOUNT OBSERVED (*)     All other components within normal limits   PREGNANCY, URINE   MICROSCOPIC URINALYSIS   POCT OCCULT BLOOD STOOL NON CA SCREEN       Procedures: None      ED Course, and Reassessment: Patient has negative workup in the emergency department.  There was leukocyte Estrace which is present in the patient's urine however this is a contaminated specimen and there is no bacteria which is present on the going to treat her for urinary tract infection.  The patient has had recent hospitalization and was being treated for a pneumonia.  The patient's CT scan which I performed on her abdomen does not show any evidence of acute pathology but we did see the fact that she had the previous pneumonia as there is still evidence on the CT scan.  In addition even though she does not have acute pathology the patient does have an increased amount of stool there is evidence to that she may have very mild ileus secondary to that of the constipation.  The patient's constipation most likely has been contributing to the fact that she has a bleeding internal hemorrhoid as well as anal fissure.  I will start the patient on docusate sodium as well as senna.  Patient needs to follow-up with her primary care provider  outpatient basis patient's not require hospitalization at this time.  The patient was given 2 complementary Percocet for her chronic low back pain but I am not going to be given her any narcotic prescriptions these can be followed up and prescribed by family doctor as needed or she can be referred to chronic pain management specialist for her chronic pain issues  I also reinforced to the patient that she needs to have a repeat chest x-Pearley Baranek or possibly even a CT scan of the chest to make sure that this area is clearing from her previous pneumonia.  There is also evidence of lymphadenopathy that is present which may need to be better delineated by CT scan of the chest but this can be accomplished on outpatient basis does not change current management  History from : Patient    Limitations to history : None    Patient was given the following medications:  Medications   morphine sulfate (PF) injection 4 mg (4 mg IntraVENous Given 01/01/23 1928)   ondansetron (ZOFRAN) injection 4 mg (4 mg IntraVENous Given 01/01/23 1928)   iopamidol (ISOVUE-370) 76 % injection 100 mL (100 mLs IntraVENous Given 01/01/23 2003)   docusate sodium (COLACE) capsule 200 mg (200 mg Oral Given 01/01/23 2107)   sennosides-docusate sodium (SENOKOT-S) 8.6-50 MG tablet 2 tablet (2 tablets Oral Given 01/01/23 2107)   oxyCODONE-acetaminophen (PERCOCET) 5-325 MG per tablet 2 tablet (2 tablets Oral Given 01/01/23 2107)       Medications:   Discharge Medication List as of 01/01/2023  8:49 PM        START taking these medications    Details   docusate sodium (COLACE) 100 MG capsule Take 1 capsule by mouth 2 times daily, Disp-60 capsule, R-0Normal      senna (SENOKOT) 8.6 MG TABS tablet Take 1 tablet by mouth daily, Disp-120 tablet, R-0Normal             Independent Imaging Interpretation by Radiology  CT ABDOMEN PELVIS W IV CONTRAST   Final Result      1. Moderate stool noted throughout entire colon which may indicate constipation. Some fluid-filled loops a  small bowel likely reflective of constipation or an ileus.   2. fatty liver appreciated with contracted gallbladder.   3. Multiple fibroids in the uterus some of which appear calcified and uterine enlargement noted.   4. Normal-appearing renal outlines   5. Wedge-shaped areas of consolidation in the right middle lobe and another nodule in the lingula anteriorly.      Electronically signed by Deloris Ping Munschauer, DO          EKG (if obtained): (All EKG's are interpreted by myself in the absence of a cardiologist)  None     Chronic conditions affecting care: None     Discussion with Other Profesionals : None    Social Determinants : None    Records Reviewed: Epic, Care Everywhere, PCP outpatient, Inpatient, EMS run sheets and External ER notes if available were reviewed           Disposition   Appropriate for outpatient management       Overall, the Condition and plan discussed with patient  in detail. Patient  agrees with plan and verbalizes no questions or concerns.  I discussed at length the patient's presentation and possible differential diagnoses that could be associated with this presentation.  I also discussed at length concerning worsening findings, new signs and symptoms that may warrant repeat examination as well as the patient just returning if they are not feeling any better or having problems with follow-up   In addition, risk, benefits, and side effects of medicines discussed with the patient ,  and patient  agrees with plan. For any new medications prescribed today, patient  was educated about indications for the medication, how to take the medication, and potential side effects of the medication.    I am the Primary Clinician of Record.        Final Impression    1. History of bacterial pneumonia    2. Constipation, unspecified constipation type    3. Rectal bleeding              Roberto Scales, DO  01/01/23 2154

## 2023-01-01 NOTE — ED Notes (Signed)
Discharge instructions and prescriptions reviewed with pt and verbalizes understanding.

## 2023-01-01 NOTE — Discharge Instructions (Signed)
Hopefully these medications will help alleviate the constipation which should also help resolve the rectal bleeding.  If any of these things become worse over time or if you begin having any new symptoms especially increasing abdominal pain or increasing abdominal distention we will need to see you again

## 2023-01-02 MED ORDER — OXYCODONE-ACETAMINOPHEN 5-325 MG PO TABS
5-325 | Freq: Once | ORAL | Status: AC
Start: 2023-01-02 — End: 2023-01-01
  Administered 2023-01-02: 01:00:00 2 via ORAL

## 2023-01-02 MED ORDER — SENNA-DOCUSATE SODIUM 8.6-50 MG PO TABS
Freq: Once | ORAL | Status: AC
Start: 2023-01-02 — End: 2023-01-01
  Administered 2023-01-02: 01:00:00 2 via ORAL

## 2023-01-02 MED ORDER — DOCUSATE SODIUM 100 MG PO CAPS
100 | Freq: Once | ORAL | Status: AC
Start: 2023-01-02 — End: 2023-01-01
  Administered 2023-01-02: 01:00:00 200 mg via ORAL

## 2023-01-02 MED ORDER — SENNA 8.6 MG PO TABS
8.6 | ORAL_TABLET | Freq: Every day | ORAL | 0 refills | Status: AC
Start: 2023-01-02 — End: ?

## 2023-01-02 MED ORDER — DOCUSATE SODIUM 100 MG PO CAPS
100 | ORAL_CAPSULE | Freq: Two times a day (BID) | ORAL | 0 refills | Status: AC
Start: 2023-01-02 — End: 2023-01-31

## 2023-01-02 MED FILL — SENNA-S 8.6-50 MG PO TABS: ORAL | Qty: 1

## 2023-01-02 MED FILL — OXYCODONE-ACETAMINOPHEN 5-325 MG PO TABS: 5-325 MG | ORAL | Qty: 2

## 2023-01-02 MED FILL — DOK 100 MG PO CAPS: 100 MG | ORAL | Qty: 2

## 2023-01-04 ENCOUNTER — Inpatient Hospital Stay
Admit: 2023-01-04 | Discharge: 2023-01-05 | Disposition: A | Discharge status: Home / Self Care | Payer: PRIVATE HEALTH INSURANCE | Attending: Emergency Medicine

## 2023-01-04 DIAGNOSIS — N3001 Acute cystitis with hematuria: Secondary | ICD-10-CM

## 2023-01-04 LAB — CBC WITH AUTO DIFFERENTIAL
Basophils %: 0.8 % (ref 0–1)
Basophils Absolute: 0 10*3/uL
Eosinophils %: 2.4 % (ref 0–3)
Eosinophils Absolute: 0.1 10*3/uL
Hematocrit: 35.1 % — ABNORMAL LOW (ref 37–47)
Hemoglobin: 11.5 GM/DL — ABNORMAL LOW (ref 12.5–16.0)
Immature Neutrophil %: 0.2 % (ref 0–0.43)
Lymphocytes %: 32.5 % (ref 24–44)
Lymphocytes Absolute: 1.6 10*3/uL
MCH: 30.9 PG (ref 27–31)
MCHC: 32.8 % (ref 32.0–36.0)
MCV: 94.4 FL (ref 78–100)
MPV: 8.8 FL (ref 7.5–11.1)
Monocytes %: 10.8 % — ABNORMAL HIGH (ref 0–4)
Monocytes Absolute: 0.5 10*3/uL
Neutrophils %: 53.3 % (ref 36–66)
Platelets: 321 10*3/uL (ref 140–440)
RBC: 3.72 10*6/uL — ABNORMAL LOW (ref 4.2–5.4)
RDW: 13.5 % (ref 11.7–14.9)
Segs Absolute: 2.7 10*3/uL
Total Immature Neutrophil: 0.01 10*3/uL
WBC: 5 10*3/uL (ref 4.0–10.5)

## 2023-01-04 LAB — MICROSCOPIC URINALYSIS
Cast Type: NONE SEEN /HPF
Crystal Type: NEGATIVE /HPF
Epithelial Cells, UA: 3 /HPF
RBC, UA: 15 /HPF — ABNORMAL HIGH (ref 0–6)
WBC, UA: 20 /HPF — ABNORMAL HIGH (ref 0–5)

## 2023-01-04 LAB — COMPREHENSIVE METABOLIC PANEL
ALT: 24 U/L (ref 10–40)
AST: 33 IU/L (ref 15–37)
Albumin: 3.8 GM/DL (ref 3.4–5.0)
Alkaline Phosphatase: 110 IU/L (ref 40–129)
Anion Gap: 12 (ref 7–16)
BUN: 11 MG/DL (ref 6–23)
CO2: 24 MMOL/L (ref 21–32)
Calcium: 9.4 MG/DL (ref 8.3–10.6)
Chloride: 102 mMol/L (ref 99–110)
Creatinine: 0.5 MG/DL — ABNORMAL LOW (ref 0.6–1.1)
Est, Glom Filt Rate: >90 mL/min/{1.73_m2} (ref >60)
Glucose: 102 MG/DL — ABNORMAL HIGH (ref 70–99)
Potassium: 4.5 MMOL/L (ref 3.5–5.1)
Sodium: 138 MMOL/L (ref 135–145)
Total Bilirubin: 0.2 MG/DL (ref 0.0–1.0)
Total Protein: 7.3 GM/DL (ref 6.4–8.2)

## 2023-01-04 LAB — URINALYSIS
Bilirubin Urine: NEGATIVE MG/DL
Glucose, Urine: NEGATIVE MG/DL
Ketones, Urine: NEGATIVE MG/DL
Nitrite Urine, Quantitative: NEGATIVE
Protein, UA: NEGATIVE MG/DL
Specific Gravity, UA: 1.015 (ref 1.001–1.035)
Urobilinogen, Urine: 0.2 MG/DL (ref 0.2–1.0)
pH, Urine: 6.5 (ref 5.0–8.0)

## 2023-01-04 LAB — TROPONIN: Troponin, High Sensitivity: <6 ng/L (ref 0–14)

## 2023-01-04 LAB — MAGNESIUM: Magnesium: 2 mg/dl (ref 1.8–2.4)

## 2023-01-04 LAB — LACTATE, SEPSIS: Lactic Acid, Sepsis: 1.1 mMOL/L (ref 0.4–2.0)

## 2023-01-04 NOTE — ED Provider Notes (Incomplete)
Emergency Department Encounter  Location: [facility] EMERGENCY DEPARTMENT    Patient: Shari Henderson  MRN: [id number]  DOB: [birth year]  Date of evaluation: 01/04/2023  ED Provider: Lupita Shutter, DO    Chief Complaint:    Abdominal Pain (Having abd pain was seen here Friday.)    HOPI:  Shari Henderson is a 52 y.o. female that presents to the emergency department with a number of complaints.  Primary concern seems to be for abdominal discomfort.  States that this has been present for 3 to 4 days.  Does report a good appetite without vomiting.  No reported change in bowel habits.  Reports occasional dysuria.  No vaginal bleeding or discharge.  No chest pain or shortness of breath.    She also expresses concern that she has syphilis.  Describes that she completed a 14-day course of inpatient therapy in [place] but then was told after that that she might have syphilis again.  Has not been sexually active since treatment.      Past Medical History:   Diagnosis Date    Bipolar 1 disorder (HCC)     Diabetes mellitus (HCC)     DVT (deep venous thrombosis) (HCC)     Schizo affective schizophrenia (HCC)      Past Surgical History:   Procedure Laterality Date    VEIN SURGERY Right     vein and artery reconstruction in right leg     No family history on file.  Social History     Socioeconomic History    Marital status: Single     Spouse name: Not on file    Number of children: Not on file    Years of education: Not on file    Highest education level: Not on file   Occupational History    Not on file   Tobacco Use    Smoking status: Every Day     Current packs/day: 0.50     Types: Cigarettes    Smokeless tobacco: Never   Vaping Use    Vaping Use: Never used   Substance and Sexual Activity    Alcohol use: Not Currently    Drug use: Not Currently     Types: Opiates     Sexual activity: Not on file   Other Topics Concern    Not on file   Social History Narrative    Not on file     Social Determinants of Health      Financial Resource Strain: Not on file   Food Insecurity: Not on file   Transportation Needs: Not on file   Physical Activity: Not on file   Stress: Not on file   Social Connections: Not on file   Intimate Partner Violence: Not on file   Housing Stability: Not on file     No current facility-administered medications for this encounter.     Current Outpatient Medications   Medication Sig Dispense Refill    docusate sodium (COLACE) 100 MG capsule Take 1 capsule by mouth 2 times daily 60 capsule 0    senna (SENOKOT) 8.6 MG TABS tablet Take 1 tablet by mouth daily 120 tablet 0    albuterol sulfate HFA (VENTOLIN HFA) 108 (90 Base) MCG/ACT inhaler Inhale 2 puffs into the lungs every 6 hours as needed for Wheezing      apixaban (ELIQUIS) 5 MG TABS tablet Take 1 tablet by mouth 2 times daily      atropine 1 % ophthalmic solution Place  1 drop into both eyes in the morning and at bedtime      hydrOXYzine HCl (ATARAX) 50 MG tablet Take 1 tablet by mouth every 8 hours as needed for Anxiety      lidocaine (HM LIDOCAINE PATCH) 4 % external patch Place 1 patch onto the skin daily      methadone (DOLOPHINE) 10 MG/ML solution Take 6 mLs by mouth every 4 hours as needed for Pain. Max Daily Amount: 360 mg      prazosin (MINIPRESS) 2 MG capsule Take 1 capsule by mouth nightly      prednisoLONE acetate (PRED FORTE) 1 % ophthalmic suspension Place 1 drop into both eyes 4 times daily 1 drop left eye 4 times a day, 1 drop right eye 1 time a day.      propranolol (INDERAL) 20 MG tablet Take 1 tablet by mouth 2 times daily as needed (anxiety)      tiotropium (SPIRIVA) 18 MCG inhalation capsule Inhale 1 capsule into the lungs daily      triamcinolone (KENALOG) 0.1 % cream Apply topically 2 times daily Apply to wound one time a day      venlafaxine (EFFEXOR XR) 75 MG extended release capsule Take 1 capsule by mouth daily       Allergies   Allergen Reactions    Tylenol [Acetaminophen]      Liver failure       Nursing Notes  Reviewed    Physical Exam:  ED Triage Vitals [01/04/23 1721]   Enc Vitals Group      BP 123/82      Pulse 73      Respirations 16      Temp 97.9 F (36.6 C)      Temp Source Oral      SpO2 98 %      Weight - Scale 61.6 kg (135 lb 12.8 oz)      Height 1.676 m (5\' 6" )      Head Circumference       Peak Flow       Pain Score       Pain Loc       Pain Edu?       Excl. in GC?      GENERAL APPEARANCE: Awake and alert. Cooperative. No acute distress. ***  HEAD: Normocephalic. Atraumatic.   EYES: EOM's grossly intact. Sclera anicteric.   ENT: Tolerates saliva. No trismus.   NECK: Supple. Trachea midline.   CARDIO: RRR. Radial pulse 2+.   LUNGS: Respirations unlabored. CTAB.  ABDOMEN: Soft. Non-distended. Non-tender.    EXTREMITIES: No acute deformities.   SKIN: Warm and dry.   NEUROLOGICAL: No gross facial drooping. Moves all 4 extremities spontaneously.   PSYCHIATRIC: Normal mood.     Labs:  Results for orders placed or performed during the hospital encounter of 01/04/23   EKG 12 Lead   Result Value Ref Range    Ventricular Rate 63 BPM    Atrial Rate 63 BPM    P-R Interval 124 ms    QRS Duration 80 ms    Q-T Interval 484 ms    QTc Calculation (Bazett) 495 ms    P Axis 59 degrees    R Axis 37 degrees    T Axis 60 degrees    Diagnosis       Normal sinus rhythm  Normal ECG  No previous ECGs available         Radiographs (if obtained):  []  The following  radiograph was interpreted by myself in the absence of a radiologist:  [x]  Radiologist's Report reviewed at time of ED visit:  ***    CC/HPI Summary, DDx, ED Course, and Reassessment: ***    History from : Patient      Patient was given the following medications:  Medications - No data to display    Independent Imaging Interpretation by me: ***    EKG (if obtained): (All EKG's are interpreted by myself in the absence of a cardiologist) sinus rhythm at 63.  Normal axis with poor R wave progression.  No ST elevation or depression.  No prior tracing available for  comparison.      Chronic conditions affecting care: Schizoaffective schizophrenia, DVT, DM, HTN    {Discussion with Other Profesionals (Optional):52703}    {Social Determinants (Optional):53672}    Records Reviewed : Other office visit with primary care is reviewed .  It appears patient had a syphilis test ordered today.  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  There is a note from ID with recommendations for treatment but it is unclear if patient was prescribed these medications.    Disposition Considerations (tests considered but not done, Shared Decision Making, Pt Expectation of Test or Tx.): ***    {Escalation of care, including admission/OBS considered:53682}    I am the Primary Clinician of Record.    Final Impression:  No diagnosis found.  DISPOSITION        Patient referred to:  No follow-up provider specified.  Discharge medications:  New Prescriptions    No medications on file     (Please note that portions of this note may have been completed with a voice recognition program. Efforts were made to edit the dictations but occasionally words are mis-transcribed.)    Lupita Shutter, DO

## 2023-01-04 NOTE — Discharge Instructions (Addendum)
-  Take ibuprofen (600 mg, every 6-8 hours) as needed for pain  -Take Keflex as prescribed  -Take Pyridium for burning urinary pain  -Follow-up with your primary care doctor symptoms do not get better in the next 3 to 5 days, go see Dr.Aijaz if you need a new one  -Come back to emergency department if develop severe pain, fever, severe vomiting, or if you are concerned

## 2023-01-04 NOTE — ED Provider Notes (Signed)
ED Course as of 01/04/23 2105   Mon Jan 04, 2023   1909 Schizoaffective, substance use, DM, HTN  Has been here 4 times in the last 2 weeks  Here with abdominal pain, scanned last time, also dysuria. 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 Saw today by PCP for that.   Pend: UA and labs  Dispo: likely discharge [AR]   2017 Nitrite Urine, Quantitative: NEGATIVE [AR]   2017 Leukocyte Esterase, Urine(!): LARGE NUMBER OR AMOUNT OBSERVED [AR]   2017 RBC, UA(!): 15 [AR]   2017 WBC, UA(!): 20 [AR]   2017 Patient given Keflex and discharged in stable condition [AR]   2020 Lactic Acid, Sepsis: 1.1 [AR]      ED Course User Index  [AR] Tylene Fantasia, MD     Dispo: Discharged in a stable condition on Keflex     Tylene Fantasia, MD  01/05/23 [phone]

## 2023-01-05 LAB — EKG 12-LEAD
Atrial Rate: 63 {beats}/min
Diagnosis: NORMAL
P Axis: 59 degrees
P-R Interval: 124 ms
Q-T Interval: 484 ms
QRS Duration: 80 ms
QTc Calculation (Bazett): 495 ms
R Axis: 37 degrees
T Axis: 60 degrees
Ventricular Rate: 63 {beats}/min

## 2023-01-05 MED ORDER — CEPHALEXIN 500 MG PO CAPS
500 | ORAL_CAPSULE | Freq: Two times a day (BID) | ORAL | 0 refills | Status: AC
Start: 2023-01-05 — End: 2023-01-11

## 2023-01-05 MED ORDER — PHENAZOPYRIDINE HCL 100 MG PO TABS
100 | ORAL_TABLET | Freq: Three times a day (TID) | ORAL | 0 refills | Status: AC | PRN
Start: 2023-01-05 — End: 2023-01-06

## 2023-01-05 MED ORDER — CEPHALEXIN 500 MG PO CAPS
500 | Freq: Once | ORAL | Status: DC
Start: 2023-01-05 — End: 2023-01-04

## 2023-01-05 MED FILL — CEPHALEXIN 500 MG PO CAPS: 500 MG | ORAL | Qty: 1

## 2023-01-06 LAB — CULTURE, URINE: Culture: NO GROWTH

## 2023-01-21 ENCOUNTER — Inpatient Hospital Stay: Payer: PRIVATE HEALTH INSURANCE

## 2023-01-21 DIAGNOSIS — H53133 Sudden visual loss, bilateral: Secondary | ICD-10-CM

## 2023-01-21 LAB — CBC WITH AUTO DIFFERENTIAL
Basophils %: 0.5 % (ref 0–1)
Basophils Absolute: 0 10*3/uL
Eosinophils %: 1.7 % (ref 0–3)
Eosinophils Absolute: 0.1 10*3/uL
Hematocrit: 38.9 % (ref 37–47)
Hemoglobin: 12.3 GM/DL — ABNORMAL LOW (ref 12.5–16.0)
Immature Neutrophil %: 0.2 % (ref 0–0.43)
Lymphocytes %: 31 % (ref 24–44)
Lymphocytes Absolute: 1.8 10*3/uL
MCH: 29.9 PG (ref 27–31)
MCHC: 31.6 % — ABNORMAL LOW (ref 32.0–36.0)
MCV: 94.6 FL (ref 78–100)
MPV: 9 FL (ref 7.5–11.1)
Monocytes %: 9.4 % — ABNORMAL HIGH (ref 0–4)
Monocytes Absolute: 0.6 10*3/uL
Neutrophils %: 57.2 % (ref 36–66)
Neutrophils Absolute: 3.4 10*3/uL
Platelets: 236 10*3/uL (ref 140–440)
RBC: 4.11 10*6/uL — ABNORMAL LOW (ref 4.2–5.4)
RDW: 13.3 % (ref 11.7–14.9)
Total Immature Neutrophil: 0.01 10*3/uL
WBC: 5.9 10*3/uL (ref 4.0–10.5)

## 2023-01-21 LAB — T PALLIDUM SCREEN W/REFLEX: T. Pallidum Screen: POSITIVE — AB

## 2023-01-22 LAB — RPR: RPR: NONREACTIVE

## 2023-01-24 LAB — QUANTIFERON, INCUBATED
QuantiFERON Nil: 0.02 IU/mL
QuantiFERON-TB minus NIL: >10 IU/mL
Quantiferon TB1 Minus NIL: 0 IU/mL (ref 0.00–0.34)
Quantiferon TB2 Minus NIL: 0.01 IU/mL (ref 0.00–0.34)
Quantiferon(r) TB Gold (Incubated): NEGATIVE IU/ML

## 2023-01-25 DIAGNOSIS — K029 Dental caries, unspecified: Secondary | ICD-10-CM

## 2023-01-25 LAB — FTA ANTIBODIES, IGG AND IGM: T Pallidun Ab: REACTIVE — AB

## 2023-01-25 NOTE — ED Provider Notes (Signed)
Triage Chief Complaint:    Dental Pain (Right and left lower)    HPI   Shari Henderson is a 52 y.o. female that presents for evaluation of bilateral lower jaw pain.  The patient states that she had some teeth that is spontaneously fallen out.  She had some pain associated with that.  Feels that the pain is on both sides.  Worse with palpation.  She is having pain with eating as well.  She scheduled dental appointment but cannot get in for 2 weeks.  She has not had any fevers or chills.  No change to voice.  No chest pain or shortness of breath.  No other areas of concern.  No recent antibiotics.  No recent trauma    History from : Patient    Limitations to history : None    ROS - see HPI, below listed is current ROS at time of my eval:  10 systems reviewed and negative except as above.     Past Medical History:   Diagnosis Date    Bipolar 1 disorder (HCC)     Diabetes mellitus (HCC)     DVT (deep venous thrombosis) (HCC)     Schizo affective schizophrenia (HCC)      Past Surgical History:   Procedure Laterality Date    VEIN SURGERY Right     vein and artery reconstruction in right leg     No family history on file.  Social History     Socioeconomic History    Marital status: Single     Spouse name: Not on file    Number of children: Not on file    Years of education: Not on file    Highest education level: Not on file   Occupational History    Not on file   Tobacco Use    Smoking status: Every Day     Current packs/day: 0.50     Types: Cigarettes    Smokeless tobacco: Never   Vaping Use    Vaping Use: Never used   Substance and Sexual Activity    Alcohol use: Not Currently    Drug use: Not Currently     Types: Opiates     Sexual activity: Not on file   Other Topics Concern    Not on file   Social History Narrative    Not on file     Social Determinants of Health     Financial Resource Strain: Not on file   Food Insecurity: Not on file   Transportation Needs: Not on file   Physical Activity: Not on file   Stress: Not on  file   Social Connections: Not on file   Intimate Partner Violence: Not on file   Housing Stability: Not on file     Current Facility-Administered Medications   Medication Dose Route Frequency Provider Last Rate Last Admin    BUPivacaine (PF) (MARCAINE) 0.5 % injection 150 mg  30 mL IntraDERmal Once Jeris Penta, MD        amoxicillin-clavulanate (AUGMENTIN) 875-125 MG per tablet 1 tablet  1 tablet Oral Once Jeris Penta, MD         Current Outpatient Medications   Medication Sig Dispense Refill    amoxicillin-clavulanate (AUGMENTIN) 875-125 MG per tablet Take 1 tablet by mouth 2 times daily for 10 days 20 tablet 0    ketorolac (TORADOL) 10 MG tablet Take 1 tablet by mouth every 6 hours as needed for Pain 20 tablet 0  docusate sodium (COLACE) 100 MG capsule Take 1 capsule by mouth 2 times daily 60 capsule 0    senna (SENOKOT) 8.6 MG TABS tablet Take 1 tablet by mouth daily 120 tablet 0    albuterol sulfate HFA (VENTOLIN HFA) 108 (90 Base) MCG/ACT inhaler Inhale 2 puffs into the lungs every 6 hours as needed for Wheezing      apixaban (ELIQUIS) 5 MG TABS tablet Take 1 tablet by mouth 2 times daily      atropine 1 % ophthalmic solution Place 1 drop into both eyes in the morning and at bedtime      hydrOXYzine HCl (ATARAX) 50 MG tablet Take 1 tablet by mouth every 8 hours as needed for Anxiety      lidocaine (HM LIDOCAINE PATCH) 4 % external patch Place 1 patch onto the skin daily      methadone (DOLOPHINE) 10 MG/ML solution Take 6 mLs by mouth every 4 hours as needed for Pain. Max Daily Amount: 360 mg      prazosin (MINIPRESS) 2 MG capsule Take 1 capsule by mouth nightly      prednisoLONE acetate (PRED FORTE) 1 % ophthalmic suspension Place 1 drop into both eyes 4 times daily 1 drop left eye 4 times a day, 1 drop right eye 1 time a day.      propranolol (INDERAL) 20 MG tablet Take 1 tablet by mouth 2 times daily as needed (anxiety)      tiotropium (SPIRIVA) 18 MCG inhalation capsule Inhale 1 capsule into the lungs  daily      triamcinolone (KENALOG) 0.1 % cream Apply topically 2 times daily Apply to wound one time a day      venlafaxine (EFFEXOR XR) 75 MG extended release capsule Take 1 capsule by mouth daily       Allergies   Allergen Reactions    Tylenol [Acetaminophen]      Liver failure       Nursing Notes Reviewed    Physical Exam:     ED Triage Vitals [01/25/23 2053]   Enc Vitals Group      BP (!) 118/45      Pulse (!) 110      Respirations 18      Temp 97.4 F (36.3 C)      Temp Source Infrared      SpO2 94 %      Weight - Scale 61.7 kg (136 lb)      Height 1.676 m (5\' 6" )      Head Circumference       Peak Flow       Pain Score       Pain Loc       Pain Edu?       Excl. in GC?        My pulse ox interpretation is - normal    General appearance:  No acute distress.   Skin:  Warm. Dry.   Eye:  Extraocular movements intact.     Ears, nose, mouth and throat:  Oral mucosa moist, no edema under the tongue, posterior pharynx without erythema, exudate or edema, slight gumline erythema noted, no abscess, dental caries noted  Neck:  Trachea midline.  No tender palpable cervical lymphadenopathy  Extremity:  Normal ROM     Respiratory:  Respirations nonlabored.             Neurological:  Alert and oriented      I have reviewed and interpreted all of  the currently available lab results from this visit (if applicable):  No results found for this visit on 01/25/23.   Radiographs (if obtained):  []  The following radiograph was interpreted by myself in the absence of a radiologist:   []  Radiologist's Report Reviewed:  No orders to display       Procedures:  Procedure Note - Dental Block:  Patient offered a dental nerve block for pain control. Questions were sought and answered and verbal consent was given by patient for the procedure.  Bilateral inferior alveolar dental nerve block performed with 10 mL of 0.5% bupivacaine without epi. Lashaya Graciano tolerated the procedure well, no acute complications. Adequate anesthesia  achieved.    Chart review shows recent radiographs:  CT ABDOMEN PELVIS W IV CONTRAST    Result Date: 01/01/2023  EXAM: CT ABDOMEN PELVIS W IV CONTRAST INDICATION: LLQ pain and rectal bleeding. COMPARISON: None TECHNIQUE: Standard per department protocol. Coronal and sagittal reconstructions were obtained. Up-to-date CT equipment and radiation dose reduction techniques were employed. CONTRAST: 100 mL Isovue 370 intravenous FINDINGS: Scout: No additional findings. ABDOMEN/PELVIS: Lung bases: Abnormal right middle lobe nodular and wedge-shaped consolidations, incompletely assessed as seen on axial image 3 measuring 2.9 x 1.9 cm and more inferior right medial basilar consolidation measuring up to 2.3 x 1.4 cm In the lingula there is a nodule the peripheral margin measuring 8 mm x 7 mm Liver and biliary system: Minimal fatty infiltration liver without focal mass. Normal vascularity. Pancreas: Normal. Spleen: Normal. Adrenal glands: Normal. Genitourinary: No hydronephrosis or nephrolithiasis. Bowel: The small bowel loops are normal in caliber. No bowel wall thickening. Moderate to large amount of stool throughout the colon most likely secondary to constipation. Some minimal fluid-filled loops of distal small bowel are noted. Gas and stool seen within the rectum Abdominal wall: Normal. Peritoneum/retroperitoneum: No fluid collections are seen. No free air. No gross adenopathy Uterus intact and anteverted containing multiple fibroids some of which are calcified. Urinary bladder unremarkable. Right inguinal lymph nodes appreciated as enlarged measuring up to 1.8 x 1.3 cm on image 163 and another inguinal lymph node on the right measuring 1.8 x 1.2 cm in image 175. Normal alignment of the spine Vasculature: No aneurysm. Osseous structures: No suspicious abnormality.     1. Moderate stool noted throughout entire colon which may indicate constipation. Some fluid-filled loops a small bowel likely reflective of constipation or an  ileus. 2. fatty liver appreciated with contracted gallbladder. 3. Multiple fibroids in the uterus some of which appear calcified and uterine enlargement noted. 4. Normal-appearing renal outlines 5. Wedge-shaped areas of consolidation in the right middle lobe and another nodule in the lingula anteriorly. Electronically signed by Deloris Ping Munschauer, DO        MDM:     Discussion with Other Professionals : None    Social Determinants: Healthcare illiteracy, Psychiatric illness (i.e. anxiety, depression, substance use disorder, bipolar, schizophrenia, mood disorder, etc), and No follow up    Records Reviewed : None    Chronic conditions affecting care: None    Considered CT imaging however, no discernable abscess and with shared decision making with the patient we elected to defer.    Patient was given the following medications:  Medications   BUPivacaine (PF) (MARCAINE) 0.5 % injection 150 mg (has no administration in time range)   amoxicillin-clavulanate (AUGMENTIN) 875-125 MG per tablet 1 tablet (has no administration in time range)       Disposition Discussion:  Patient presenting for dental pain  which is most likely secondary to tooth decay/dental caries.  Also, we cannot exclude early periapical abscess.  Clinically there is no evidence of oral abscess otherwise, no Ludwig's angina.  I discussed that if a period of abscess is developing it may require drainage in the future, currently there are no drainable sites, patient has no evidence of sepsis.  The patient will be given antibiotics, pain medications, and dental clinic/office list provided.  I stressed the need for dental followup as emergency department treatment is not the definitive treatment of choice.  Patient understands and agrees with the plan, outpatient follow up instructions given, return warnings given.    Disposition: Discharged     I am the primary physician of record    Clinical Impression:  1. Dental decay      Disposition referral (if  applicable):  [email or web address]  Get online care: [email or web address]  Located in: [facility] [place]  Address: Ruel Favors, [address] [place], [place], [place] [address]  Phone: [phone]  Schedule an appointment as soon as possible for a visit       [facility]  [address]  [place] [address]  [phone]    If symptoms worsen    Disposition medications (if applicable):  New Prescriptions    AMOXICILLIN-CLAVULANATE (AUGMENTIN) 875-125 MG PER TABLET    Take 1 tablet by mouth 2 times daily for 10 days    KETOROLAC (TORADOL) 10 MG TABLET    Take 1 tablet by mouth every 6 hours as needed for Pain       Comment: Please note this report has been produced using speech recognition software and may contain errors related to that system including errors in grammar, punctuation, and spelling, as well as words and phrases that may be inappropriate. If there are any questions or concerns please feel free to contact the dictating provider for clarification.       Jeris Penta, MD  01/25/23 2118

## 2023-01-25 NOTE — Discharge Instructions (Addendum)
[facility]:

## 2023-01-26 ENCOUNTER — Inpatient Hospital Stay
Admit: 2023-01-26 | Discharge: 2023-01-26 | Disposition: A | Discharge status: Home / Self Care | Payer: PRIVATE HEALTH INSURANCE | Attending: Emergency Medicine

## 2023-01-26 MED ORDER — KETOROLAC TROMETHAMINE 10 MG PO TABS
10 | ORAL_TABLET | Freq: Four times a day (QID) | ORAL | 0 refills | Status: DC | PRN
Start: 2023-01-26 — End: 2023-01-25

## 2023-01-26 MED ORDER — AMOXICILLIN-POT CLAVULANATE 875-125 MG PO TABS
875-125 | Freq: Once | ORAL | Status: AC
Start: 2023-01-26 — End: 2023-01-25
  Administered 2023-01-26: 02:00:00 1 via ORAL

## 2023-01-26 MED ORDER — BUPIVACAINE HCL (PF) 0.5 % IJ SOLN
0.5 | Freq: Once | INTRAMUSCULAR | Status: AC
Start: 2023-01-26 — End: 2023-01-25
  Administered 2023-01-26: 01:00:00 150 mL via INTRADERMAL

## 2023-01-26 MED ORDER — AMOXICILLIN-POT CLAVULANATE 875-125 MG PO TABS
875-125 | ORAL_TABLET | Freq: Two times a day (BID) | ORAL | 0 refills | Status: AC
Start: 2023-01-26 — End: 2023-02-04

## 2023-01-26 MED FILL — BUPIVACAINE HCL (PF) 0.5 % IJ SOLN: 0.5 % | INTRAMUSCULAR | Qty: 30

## 2023-01-26 MED FILL — AMOXICILLIN-POT CLAVULANATE 875-125 MG PO TABS: 875-125 MG | ORAL | Qty: 1

## 2023-02-04 ENCOUNTER — Emergency Department: Payer: PRIVATE HEALTH INSURANCE

## 2023-02-04 ENCOUNTER — Emergency Department: Admit: 2023-02-04 | Payer: PRIVATE HEALTH INSURANCE

## 2023-02-04 ENCOUNTER — Inpatient Hospital Stay
Admit: 2023-02-04 | Discharge: 2023-02-04 | Disposition: A | Discharge status: Home / Self Care | Payer: PRIVATE HEALTH INSURANCE | Attending: Emergency Medicine

## 2023-02-04 DIAGNOSIS — L03115 Cellulitis of right lower limb: Secondary | ICD-10-CM

## 2023-02-04 LAB — COMPREHENSIVE METABOLIC PANEL
ALT: 7 U/L — ABNORMAL LOW (ref 10–40)
AST: 14 IU/L — ABNORMAL LOW (ref 15–37)
Albumin: 4.1 GM/DL (ref 3.4–5.0)
Alkaline Phosphatase: 136 IU/L — ABNORMAL HIGH (ref 40–129)
Anion Gap: 14 (ref 7–16)
BUN: 6 MG/DL (ref 6–23)
CO2: 21 MMOL/L (ref 21–32)
Calcium: 9.8 MG/DL (ref 8.3–10.6)
Chloride: 102 mMol/L (ref 99–110)
Creatinine: 0.6 MG/DL (ref 0.6–1.1)
Est, Glom Filt Rate: >90 mL/min/{1.73_m2} (ref >60)
Glucose: 94 MG/DL (ref 70–99)
Potassium: 4.3 MMOL/L (ref 3.5–5.1)
Sodium: 137 MMOL/L (ref 135–145)
Total Bilirubin: 0.3 MG/DL (ref 0.0–1.0)
Total Protein: 8 GM/DL (ref 6.4–8.2)

## 2023-02-04 LAB — CBC WITH AUTO DIFFERENTIAL
Basophils %: 0.7 % (ref 0–1)
Basophils Absolute: 0 10*3/uL
Eosinophils %: 1.8 % (ref 0–3)
Eosinophils Absolute: 0.1 10*3/uL
Hematocrit: 41.3 % (ref 37–47)
Hemoglobin: 13 GM/DL (ref 12.5–16.0)
Immature Neutrophil %: 0.2 % (ref 0–0.43)
Lymphocytes %: 34.2 % (ref 24–44)
Lymphocytes Absolute: 2.1 10*3/uL
MCH: 30.6 PG (ref 27–31)
MCHC: 31.5 % — ABNORMAL LOW (ref 32.0–36.0)
MCV: 97.2 FL (ref 78–100)
MPV: 8.6 FL (ref 7.5–11.1)
Monocytes %: 9.1 % — ABNORMAL HIGH (ref 0–4)
Monocytes Absolute: 0.6 10*3/uL
Neutrophils %: 54 % (ref 36–66)
Neutrophils Absolute: 3.3 10*3/uL
Platelets: 250 10*3/uL (ref 140–440)
RBC: 4.25 10*6/uL (ref 4.2–5.4)
RDW: 13.1 % (ref 11.7–14.9)
Total Immature Neutrophil: 0.01 10*3/uL
WBC: 6.1 10*3/uL (ref 4.0–10.5)

## 2023-02-04 LAB — MAGNESIUM: Magnesium: 2 mg/dl (ref 1.8–2.4)

## 2023-02-04 LAB — LACTATE, SEPSIS: Lactic Acid, Sepsis: 1.1 mMOL/L (ref 0.4–2.0)

## 2023-02-04 MED ORDER — OXYCODONE HCL 5 MG PO TABS
5 | Freq: Once | ORAL | Status: AC
Start: 2023-02-04 — End: 2023-02-04
  Administered 2023-02-04: 22:00:00 5 mg via ORAL

## 2023-02-04 MED ORDER — CEPHALEXIN 500 MG PO CAPS
500 | ORAL_CAPSULE | Freq: Four times a day (QID) | ORAL | 0 refills | Status: AC
Start: 2023-02-04 — End: 2023-02-11

## 2023-02-04 MED ORDER — IOPAMIDOL 76 % IV SOLN
76 | Freq: Once | INTRAVENOUS | Status: AC | PRN
Start: 2023-02-04 — End: 2023-02-04
  Administered 2023-02-04: 21:00:00 100 mL via INTRAVENOUS

## 2023-02-04 MED ORDER — OXYCODONE HCL 5 MG PO TABS
5 | Freq: Once | ORAL | Status: AC
Start: 2023-02-04 — End: 2023-02-04
  Administered 2023-02-04: 19:00:00 5 mg via ORAL

## 2023-02-04 MED FILL — OXYCODONE HCL 5 MG PO TABS: 5 MG | ORAL | Qty: 1

## 2023-02-04 NOTE — Progress Notes (Signed)
Discharge education completed with pt, pt demonstrated appropriate teach back, IV' removed, pt has prescription called in to pharmacy, medication's and side effect education completed, pt has discharge paperwork, pt denies any needs or questions at this time.

## 2023-02-04 NOTE — ED Provider Notes (Signed)
Emergency Department Encounter  Location: [facility] EMERGENCY DEPARTMENT    Patient: Shari Henderson  MRN: [id number]  DOB: November 19, 1970  Date of evaluation: 02/04/2023  ED Provider: Lupita Shutter, DO    Chief Complaint:    Leg Pain (DVT, R leg pain worsening per pt )    HOPI:  Shari Henderson is a 52 y.o. female that presents to the emergency department with concern for pain in her right lower extremity.  Patient states that she "always" has pain in her calf and attributes this to history of prior trauma as well as DVT.  States she is on Xarelto and has been compliant with this.  Denies any worsening in her usual discomfort, swelling, weakness or loss of sensation in the leg.  Patient points to the anterior aspect of her right lower leg as the site of discomfort.  States she has had a nonhealing wound there for "a long time" following the trauma.  It is typically not sore at that site but has been over the past 3 days.  She denies fever or chills.  Reports she has had normal appetite without vomiting or diarrhea.  No recent travel, illness, or prolonged immobility.       Past Medical History:   Diagnosis Date    Bipolar 1 disorder (HCC)     Diabetes mellitus (HCC)     DVT (deep venous thrombosis) (HCC)     Schizo affective schizophrenia (HCC)      Past Surgical History:   Procedure Laterality Date    VEIN SURGERY Right     vein and artery reconstruction in right leg     History reviewed. No pertinent family history.  Social History     Socioeconomic History    Marital status: Single     Spouse name: Not on file    Number of children: Not on file    Years of education: Not on file    Highest education level: Not on file   Occupational History    Not on file   Tobacco Use    Smoking status: Every Day     Current packs/day: 0.50     Types: Cigarettes    Smokeless tobacco: Never   Vaping Use    Vaping Use: Never used   Substance and Sexual Activity    Alcohol use: Not Currently    Drug use: Not Currently     Types:  Opiates     Sexual activity: Not on file   Other Topics Concern    Not on file   Social History Narrative    Not on file     Social Determinants of Health     Financial Resource Strain: Not on file   Food Insecurity: Not on file   Transportation Needs: Not on file   Physical Activity: Not on file   Stress: Not on file   Social Connections: Not on file   Intimate Partner Violence: Not on file   Housing Stability: Not on file     No current facility-administered medications for this encounter.     Current Outpatient Medications   Medication Sig Dispense Refill    ofloxacin (OCUFLOX) 0.3 % solution       cephALEXin (KEFLEX) 500 MG capsule Take 1 capsule by mouth 4 times daily for 7 days 28 capsule 0    senna (SENOKOT) 8.6 MG TABS tablet Take 1 tablet by mouth daily 120 tablet 0    albuterol sulfate HFA (VENTOLIN HFA)  108 (90 Base) MCG/ACT inhaler Inhale 2 puffs into the lungs every 6 hours as needed for Wheezing      apixaban (ELIQUIS) 5 MG TABS tablet Take 1 tablet by mouth 2 times daily      atropine 1 % ophthalmic solution Place 1 drop into both eyes in the morning and at bedtime      hydrOXYzine HCl (ATARAX) 50 MG tablet Take 1 tablet by mouth every 8 hours as needed for Anxiety      lidocaine (HM LIDOCAINE PATCH) 4 % external patch Place 1 patch onto the skin daily      methadone (DOLOPHINE) 10 MG/ML solution Take 6 mLs by mouth every 4 hours as needed for Pain. Max Daily Amount: 360 mg      prazosin (MINIPRESS) 2 MG capsule Take 1 capsule by mouth nightly      prednisoLONE acetate (PRED FORTE) 1 % ophthalmic suspension Place 1 drop into both eyes 4 times daily 1 drop left eye 4 times a day, 1 drop right eye 1 time a day.      propranolol (INDERAL) 20 MG tablet Take 1 tablet by mouth 2 times daily as needed (anxiety)      tiotropium (SPIRIVA) 18 MCG inhalation capsule Inhale 1 capsule into the lungs daily      triamcinolone (KENALOG) 0.1 % cream Apply topically 2 times daily Apply to wound one time a day       venlafaxine (EFFEXOR XR) 75 MG extended release capsule Take 1 capsule by mouth daily       Allergies   Allergen Reactions    Seroquel [Quetiapine] Other (See Comments)     Suicidal thoughts    Trazodone Other (See Comments)     Hallucinations    Ibuprofen Hives    Norco [Hydrocodone-Acetaminophen] Rash    Other Rash     TYLOX    Tylenol [Acetaminophen]      Liver failure    Wellbutrin [Bupropion] Other (See Comments)     Suicidal thoughts    Lyrica [Pregabalin] Nausea And Vomiting       Nursing Notes Reviewed    Physical Exam:  ED Triage Vitals [02/04/23 1412]   Enc Vitals Group      BP 122/81      Pulse 80      Respirations 18      Temp 97.9 F (36.6 C)      Temp Source Oral      SpO2 97 %      Weight - Scale 67.1 kg (148 lb)      Height 1.676 m (5\' 6" )      Head Circumference       Peak Flow       Pain Score       Pain Loc       Pain Edu?       Excl. in GC?      GENERAL APPEARANCE: Awake and alert. Cooperative.  Comfortable, overall well-appearing.  HEAD: Normocephalic. Atraumatic.   EYES: EOM's grossly intact. Sclera anicteric.   ENT: Tolerates saliva. No trismus.   NECK: Supple. Trachea midline.   CARDIO: RRR.  Palpable DP pulses in bilateral lower extremities.    LUNGS: Respirations unlabored. CTAB.  ABDOMEN: Soft. Non-distended. Non-tender.    EXTREMITIES: No posterior calf or thigh edema or asymmetry.  Patient has mild tenderness along the right posterior calf but states that this is baseline for her.  Patient has a small ulceration over the  anterior aspect of the right lower leg.  This area is very tender and somewhat warm to touch.     SKIN: Warm and dry.   NEUROLOGICAL: No gross facial drooping. Moves all 4 extremities spontaneously.   PSYCHIATRIC: Normal mood.     Labs:  Results for orders placed or performed during the hospital encounter of 02/04/23   CBC with Auto Differential   Result Value Ref Range    WBC 6.1 4.0 - 10.5 K/CU MM    RBC 4.25 4.2 - 5.4 M/CU MM    Hemoglobin 13.0 12.5 - 16.0 GM/DL     Hematocrit 16.1 37 - 47 %    MCV 97.2 78 - 100 FL    MCH 30.6 27 - 31 PG    MCHC 31.5 (L) 32.0 - 36.0 %    RDW 13.1 11.7 - 14.9 %    Platelets 250 140 - 440 K/CU MM    MPV 8.6 7.5 - 11.1 FL    Differential Type AUTOMATED DIFFERENTIAL     Neutrophils % 54.0 36 - 66 %    Lymphocytes % 34.2 24 - 44 %    Monocytes % 9.1 (H) 0 - 4 %    Eosinophils % 1.8 0 - 3 %    Basophils % 0.7 0 - 1 %    Neutrophils Absolute 3.3 K/CU MM    Lymphocytes Absolute 2.1 K/CU MM    Monocytes Absolute 0.6 K/CU MM    Eosinophils Absolute 0.1 K/CU MM    Basophils Absolute 0.0 K/CU MM    Immature Neutrophil % 0.2 0 - 0.43 %    Total Immature Neutrophil 0.01 K/CU MM   Comprehensive Metabolic Panel   Result Value Ref Range    Sodium 137 135 - 145 MMOL/L    Potassium 4.3 3.5 - 5.1 MMOL/L    Chloride 102 99 - 110 mMol/L    CO2 21 21 - 32 MMOL/L    Anion Gap 14 7 - 16    Glucose 94 70 - 99 MG/DL    BUN 6 6 - 23 MG/DL    Creatinine 0.6 0.6 - 1.1 MG/DL    Est, Glom Filt Rate >90 >60 mL/min/1.38m2    Calcium 9.8 8.3 - 10.6 MG/DL    Total Protein 8.0 6.4 - 8.2 GM/DL    Albumin 4.1 3.4 - 5.0 GM/DL    Total Bilirubin 0.3 0.0 - 1.0 MG/DL    Alkaline Phosphatase 136 (H) 40 - 129 IU/L    ALT 7 (L) 10 - 40 U/L    AST 14 (L) 15 - 37 IU/L   Magnesium   Result Value Ref Range    Magnesium 2.0 1.8 - 2.4 mg/dl   Lactate, Sepsis   Result Value Ref Range    Lactic Acid, Sepsis 1.1 0.4 - 2.0 mMOL/L       Radiographs (if obtained):  []  The following radiograph was interpreted by myself in the absence of a radiologist:  [x]  Radiologist's Report reviewed at time of ED visit:  CT TIBIA FIBULA RIGHT W CONTRAST    Result Date: 02/04/2023  EXAM: CT TIBIA FIBULA RIGHT W CONTRAST INDICATION:  nonhealing wound - now with swelling/severe pain COMPARISON: None TECHNIQUE: Axial CT imaging obtained through the right tibia/fibula. Axial images and multiplanar reformatted images were reviewed.   Up-to-date CT equipment and radiation dose reduction techniques were employed. IV  Contrast: None.   FINDINGS: BONES: No significant abnormality. No acute fracture or osteolysis JOINTS:  No dislocation. Mild degenerative changes SOFT TISSUE: Soft tissue edema. No discrete abscess. No soft tissue air. No radiopaque foreign body. Peroneus muscle atrophy. OTHER: Negative.     No acute fracture or osteolysis. Soft tissue edema. No discrete abscess. Electronically signed by Gwenlyn Saran, DO       CC/HPI Summary, DDx, ED Course, and Reassessment:   Patient is comfortable, hemodynamically stable and overall well-appearing in the ED.  Her exam is concerning for nonhealing wound with associated soft tissue infection.      Labs are reviewed and are reassuring with no acute leukocytosis.  Renal function is stable without acute electrolyte derangement.  No lactic acid elevation.    Patient is fairly tender at the site of infection.  Imaging obtained.  No evidence of abscess formation, gas in the soft tissues to suggest necrotizing infection or involvement of the bone.    History from : Patient    Patient was given the following medications:  Medications   oxyCODONE (ROXICODONE) immediate release tablet 5 mg (5 mg Oral Given 02/04/23 1511)   iopamidol (ISOVUE-370) 76 % injection 100 mL (100 mLs IntraVENous Given 02/04/23 1650)   oxyCODONE (ROXICODONE) immediate release tablet 5 mg (5 mg Oral Given 02/04/23 1812)       Chronic conditions affecting care: schizoaffective schizophrenia, hx DVT, DM      Disposition Considerations (tests considered but not done, Shared Decision Making, Pt Expectation of Test or Tx.):     Given patient's reassuring workup, I think patient is appropriate for outpatient management.  She is started on a course of antibiotics.  I also discussed with her referral to the wound care center as she states the wound on her leg has been present for quite some time.  Patient is agreeable to this.  Patient is given instructions regarding symptomatic care at home as well as return precautions. To  call PCP for follow up in 2-3 days. Patient verbalizes understanding of all instructions and is comfortable with the plan of care. They are discharged in stable condition.        I am the Primary Clinician of Record.    Final Impression:  1. Cellulitis of right lower extremity      DISPOSITION Decision To Discharge 02/04/2023 06:01:04 PM      Patient referred to:  [facility]  [address]  [place] [address]  [phone]  Schedule an appointment as soon as possible for a visit in 2 days      [facility]  [address]  [place] [address]  [phone]    If symptoms worsen    Discharge medications:  Discharge Medication List as of 02/04/2023  6:08 PM        START taking these medications    Details   cephALEXin (KEFLEX) 500 MG capsule Take 1 capsule by mouth 4 times daily for 7 days, Disp-28 capsule, R-0Normal           (Please note that portions of this note may have been completed with a voice recognition program. Efforts were made to edit the dictations but occasionally words are mis-transcribed.)    Lupita Shutter, DO       Lupita Shutter, DO  02/08/23 [phone]

## 2023-02-08 ENCOUNTER — Emergency Department: Admit: 2023-02-08 | Payer: MEDICARE

## 2023-02-08 ENCOUNTER — Inpatient Hospital Stay
Admit: 2023-02-08 | Discharge: 2023-02-09 | Disposition: A | Discharge status: Home / Self Care | Payer: MEDICARE | Attending: Emergency Medicine

## 2023-02-08 DIAGNOSIS — R109 Unspecified abdominal pain: Secondary | ICD-10-CM

## 2023-02-08 LAB — CBC WITH AUTO DIFFERENTIAL
Basophils %: 0.5 % (ref 0–1)
Basophils Absolute: 0 10*3/uL
Eosinophils %: 1.1 % (ref 0–3)
Eosinophils Absolute: 0.1 10*3/uL
Hematocrit: 41.7 % (ref 37–47)
Hemoglobin: 13.5 GM/DL (ref 12.5–16.0)
Immature Neutrophil %: 0.3 % (ref 0–0.43)
Lymphocytes %: 28.3 % (ref 24–44)
Lymphocytes Absolute: 1.8 10*3/uL
MCH: 30.5 PG (ref 27–31)
MCHC: 32.4 % (ref 32.0–36.0)
MCV: 94.3 FL (ref 78–100)
MPV: 9 FL (ref 7.5–11.1)
Monocytes %: 7.2 % — ABNORMAL HIGH (ref 0–4)
Monocytes Absolute: 0.5 10*3/uL
Neutrophils %: 62.6 % (ref 36–66)
Neutrophils Absolute: 4.1 10*3/uL
Platelets: 259 10*3/uL (ref 140–440)
RBC: 4.42 10*6/uL (ref 4.2–5.4)
RDW: 13.2 % (ref 11.7–14.9)
Total Immature Neutrophil: 0.02 10*3/uL
WBC: 6.5 10*3/uL (ref 4.0–10.5)

## 2023-02-08 LAB — URINALYSIS
Bilirubin, Urine: NEGATIVE MG/DL
Glucose, Ur: NEGATIVE MG/DL
Ketones, Urine: NEGATIVE MG/DL
Nitrite Urine, Quantitative: NEGATIVE
Protein, UA: NEGATIVE MG/DL
Specific Gravity, UA: 1.02 (ref 1.001–1.035)
Urobilinogen, Urine: >8 MG/DL — ABNORMAL HIGH (ref 0.2–1.0)
pH, Urine: 7.5 (ref 5.0–8.0)

## 2023-02-08 LAB — URINE DRUG SCREEN
Amphetamines: NEGATIVE
Barbiturate Screen, Ur: NEGATIVE
Benzodiazepine Screen, Urine: NEGATIVE
Cannabinoid Scrn, Ur: NEGATIVE
Cocaine Metabolite: NEGATIVE
Fentanyl, Ur: POSITIVE — AB
Opiates, Urine: NEGATIVE
Oxycodone: NEGATIVE

## 2023-02-08 LAB — COMPREHENSIVE METABOLIC PANEL W/ REFLEX TO MG FOR LOW K
ALT: 7 U/L — ABNORMAL LOW (ref 10–40)
AST: 16 IU/L (ref 15–37)
Albumin: 4 GM/DL (ref 3.4–5.0)
Alkaline Phosphatase: 134 IU/L — ABNORMAL HIGH (ref 40–129)
Anion Gap: 12 (ref 7–16)
BUN: 9 MG/DL (ref 6–23)
CO2: 25 MMOL/L (ref 21–32)
Calcium: 10.3 MG/DL (ref 8.3–10.6)
Chloride: 102 mMol/L (ref 99–110)
Creatinine: 0.5 MG/DL — ABNORMAL LOW (ref 0.6–1.1)
Est, Glom Filt Rate: >90 mL/min/{1.73_m2} (ref >60)
Glucose: 87 MG/DL (ref 70–99)
Potassium: 4.2 MMOL/L (ref 3.5–5.1)
Sodium: 139 MMOL/L (ref 135–145)
Total Bilirubin: 0.5 MG/DL (ref 0.0–1.0)
Total Protein: 7.9 GM/DL (ref 6.4–8.2)

## 2023-02-08 LAB — MICROSCOPIC URINALYSIS
Cast Type: NONE SEEN /HPF
Crystal Type: NEGATIVE /HPF
Epithelial Cells, UA: 40 /HPF
RBC, UA: 7 /HPF — ABNORMAL HIGH (ref 0–6)
WBC, UA: 50 /HPF — ABNORMAL HIGH (ref 0–5)

## 2023-02-08 LAB — LIPASE: Lipase: 13 IU/L (ref 13–60)

## 2023-02-08 LAB — HCG, QUANTITATIVE, PREGNANCY: hCG Quant: <1 u[IU]/mL

## 2023-02-08 LAB — BRAIN NATRIURETIC PEPTIDE: Pro-BNP: 69.22 PG/ML (ref <300)

## 2023-02-08 LAB — LACTATE, SEPSIS: Lactic Acid, Sepsis: 1 mMOL/L (ref 0.4–2.0)

## 2023-02-08 LAB — TROPONIN: Troponin, High Sensitivity: <6 ng/L (ref 0–14)

## 2023-02-08 MED ORDER — MORPHINE SULFATE (PF) 4 MG/ML IJ SOLN
4 | INTRAMUSCULAR | Status: DC | PRN
Start: 2023-02-08 — End: 2023-02-08
  Administered 2023-02-08: 23:00:00 4 mg via INTRAVENOUS

## 2023-02-08 MED ORDER — ONDANSETRON HCL 4 MG/2ML IJ SOLN
4 | INTRAMUSCULAR | Status: DC | PRN
Start: 2023-02-08 — End: 2023-02-08
  Administered 2023-02-08: 23:00:00 4 mg via INTRAVENOUS

## 2023-02-08 MED ORDER — FAMOTIDINE (PF) 20 MG/2ML IV SOLN
20 | Freq: Once | INTRAVENOUS | Status: AC
Start: 2023-02-08 — End: 2023-02-08
  Administered 2023-02-08: 22:00:00 20 mg via INTRAVENOUS

## 2023-02-08 MED ORDER — CEFTRIAXONE SODIUM 1 G IJ SOLR
1 | Freq: Once | INTRAMUSCULAR | Status: AC
Start: 2023-02-08 — End: 2023-02-08
  Administered 2023-02-08: 23:00:00 1000 mg via INTRAVENOUS

## 2023-02-08 MED ORDER — IOPAMIDOL 76 % IV SOLN
76 | Freq: Once | INTRAVENOUS | Status: AC | PRN
Start: 2023-02-08 — End: 2023-02-08
  Administered 2023-02-08: 100 mL via INTRAVENOUS

## 2023-02-08 MED ORDER — DICYCLOMINE HCL 10 MG/ML IM SOLN
10 | Freq: Once | INTRAMUSCULAR | Status: AC
Start: 2023-02-08 — End: 2023-02-08
  Administered 2023-02-08: 22:00:00 20 mg via INTRAMUSCULAR

## 2023-02-08 MED ORDER — SODIUM CHLORIDE 0.9 % IV BOLUS
0.9 | Freq: Once | INTRAVENOUS | Status: AC
Start: 2023-02-08 — End: 2023-02-08
  Administered 2023-02-08: 22:00:00 1000 mL via INTRAVENOUS

## 2023-02-08 MED ORDER — PANTOPRAZOLE SODIUM 40 MG IV SOLR
40 | Freq: Once | INTRAVENOUS | Status: AC
Start: 2023-02-08 — End: 2023-02-08
  Administered 2023-02-08: 22:00:00 40 mg via INTRAVENOUS

## 2023-02-08 MED ORDER — SODIUM CHLORIDE 0.9 % IV BOLUS
0.9 | Freq: Once | INTRAVENOUS | Status: AC
Start: 2023-02-08 — End: 2023-02-08
  Administered 2023-02-08: 23:00:00 1000 mL via INTRAVENOUS

## 2023-02-08 MED FILL — ONDANSETRON HCL 4 MG/2ML IJ SOLN: 4 MG/2ML | INTRAMUSCULAR | Qty: 2

## 2023-02-08 MED FILL — PANTOPRAZOLE SODIUM 40 MG IV SOLR: 40 MG | INTRAVENOUS | Qty: 40

## 2023-02-08 MED FILL — MORPHINE SULFATE 4 MG/ML IJ SOLN: 4 mg/mL | INTRAMUSCULAR | Qty: 1

## 2023-02-08 MED FILL — BENTYL 10 MG/ML IM SOLN: 10 MG/ML | INTRAMUSCULAR | Qty: 2

## 2023-02-08 MED FILL — CEFTRIAXONE SODIUM 1 G IJ SOLR: 1 g | INTRAMUSCULAR | Qty: 1000

## 2023-02-08 MED FILL — FAMOTIDINE (PF) 20 MG/2ML IV SOLN: 20 MG/2ML | INTRAVENOUS | Qty: 2

## 2023-02-08 NOTE — ED Notes (Signed)
Dr Ray in to discuss test results and plan for discharge.

## 2023-02-08 NOTE — ED Provider Notes (Shared)
[facility]      TRIAGE CHIEF COMPLAINT:   Abdominal Pain, Emesis, Nausea, and Hematemesis (Having Lt abd pain and has had vomiting and vomited blood twice.)      HOPI:  Shari Henderson is a 52 y.o. female that presents with complaint of left-sided abdominal pain rib pain nausea vomiting hematemesis.  Patient states she has had symptoms all day today.  She has had left-sided rib pain abdominal pain nausea vomiting streaks of blood she is on Eliquis for recurrent DVT.  Denies any diarrhea constipation urine complaints.  History of hepatitis C, denies any definite chest pain shortness of breath or cough no other questions or concerns denies any drinking or drug use.  Constant pain.    REVIEW OF SYSTEMS:  At least 10 systems reviewed and otherwise acutely negative except as in the HOPI.    Review of Systems   Constitutional: Negative.    HENT: Negative.     Eyes: Negative.    Respiratory: Negative.     Cardiovascular:  Positive for chest pain.        Left-sided rib pain   Gastrointestinal:  Positive for abdominal pain, nausea and vomiting.        Hematemesis   Endocrine: Negative.    Genitourinary:  Positive for flank pain.   Musculoskeletal:  Positive for arthralgias and myalgias.   Skin: Negative.    Allergic/Immunologic: Negative.    Neurological: Negative.    Hematological: Negative.    Psychiatric/Behavioral: Negative.     All other systems reviewed and are negative.      Past Medical History:   Diagnosis Date    Bipolar 1 disorder (HCC)     Diabetes mellitus (HCC)     DVT (deep venous thrombosis) (HCC)     Schizo affective schizophrenia (HCC)      Past Surgical History:   Procedure Laterality Date    VEIN SURGERY Right     vein and artery reconstruction in right leg     History reviewed. No pertinent family history.  Social History     Socioeconomic History    Marital status: Single     Spouse name: Not on file    Number of children: Not on file    Years of education: Not on file     Highest education level: Not on file   Occupational History    Not on file   Tobacco Use    Smoking status: Every Day     Current packs/day: 0.50     Types: Cigarettes    Smokeless tobacco: Never   Vaping Use    Vaping Use: Never used   Substance and Sexual Activity    Alcohol use: Not Currently    Drug use: Not Currently     Types: Opiates     Sexual activity: Not on file   Other Topics Concern    Not on file   Social History Narrative    Not on file     Social Determinants of Health     Financial Resource Strain: Not on file   Food Insecurity: Not on file   Transportation Needs: Not on file   Physical Activity: Not on file   Stress: Not on file   Social Connections: Not on file   Intimate Partner Violence: Not on file   Housing Stability: Not on file     Current Facility-Administered Medications   Medication Dose Route Frequency Provider Last Rate Last Admin  pantoprazole (PROTONIX) injection 40 mg  40 mg IntraVENous Once Aarian Cleaver, DO        famotidine (PEPCID) 20 mg in sodium chloride (PF) 0.9 % 10 mL injection  20 mg IntraVENous Once Vitalia Stough, DO        dicyclomine (BENTYL) injection 20 mg  20 mg IntraMUSCular Once Nicklaus Alviar, DO        sodium chloride 0.9 % bolus 1,000 mL  1,000 mL IntraVENous Once Vetta Couzens, DO        ondansetron (ZOFRAN) injection 4 mg  4 mg IntraVENous Q30 Min PRN Kanyla Omeara, DO        morphine sulfate (PF) injection 4 mg  4 mg IntraVENous Q30 Min PRN Oanh Devivo, DO        sodium chloride 0.9 % bolus 1,000 mL  1,000 mL IntraVENous Once Fayne Mediate, DO         Current Outpatient Medications   Medication Sig Dispense Refill    ofloxacin (OCUFLOX) 0.3 % solution       cephALEXin (KEFLEX) 500 MG capsule Take 1 capsule by mouth 4 times daily for 7 days 28 capsule 0    senna (SENOKOT) 8.6 MG TABS tablet Take 1 tablet by mouth daily 120 tablet 0    albuterol sulfate HFA (VENTOLIN HFA) 108 (90 Base) MCG/ACT inhaler Inhale 2 puffs into the lungs every 6  hours as needed for Wheezing      apixaban (ELIQUIS) 5 MG TABS tablet Take 1 tablet by mouth 2 times daily      atropine 1 % ophthalmic solution Place 1 drop into both eyes in the morning and at bedtime      hydrOXYzine HCl (ATARAX) 50 MG tablet Take 1 tablet by mouth every 8 hours as needed for Anxiety      lidocaine (HM LIDOCAINE PATCH) 4 % external patch Place 1 patch onto the skin daily      methadone (DOLOPHINE) 10 MG/ML solution Take 6 mLs by mouth every 4 hours as needed for Pain. Max Daily Amount: 360 mg      prazosin (MINIPRESS) 2 MG capsule Take 1 capsule by mouth nightly      prednisoLONE acetate (PRED FORTE) 1 % ophthalmic suspension Place 1 drop into both eyes 4 times daily 1 drop left eye 4 times a day, 1 drop right eye 1 time a day.      propranolol (INDERAL) 20 MG tablet Take 1 tablet by mouth 2 times daily as needed (anxiety)      tiotropium (SPIRIVA) 18 MCG inhalation capsule Inhale 1 capsule into the lungs daily      triamcinolone (KENALOG) 0.1 % cream Apply topically 2 times daily Apply to wound one time a day      venlafaxine (EFFEXOR XR) 75 MG extended release capsule Take 1 capsule by mouth daily        Allergies   Allergen Reactions    Seroquel [Quetiapine] Other (See Comments)     Suicidal thoughts    Trazodone Other (See Comments)     Hallucinations    Ibuprofen Hives    Norco [Hydrocodone-Acetaminophen] Rash    Other Rash     TYLOX    Tylenol [Acetaminophen]      Liver failure    Wellbutrin [Bupropion] Other (See Comments)     Suicidal thoughts    Lyrica [Pregabalin] Nausea And Vomiting     Current Facility-Administered Medications   Medication Dose Route Frequency Provider Last Rate Last  Admin    pantoprazole (PROTONIX) injection 40 mg  40 mg IntraVENous Once Sarahelizabeth Conway, DO        famotidine (PEPCID) 20 mg in sodium chloride (PF) 0.9 % 10 mL injection  20 mg IntraVENous Once Neilan Rizzo, DO        dicyclomine (BENTYL) injection 20 mg  20 mg IntraMUSCular Once Briannia Laba,  DO        sodium chloride 0.9 % bolus 1,000 mL  1,000 mL IntraVENous Once Kaiyon Hynes, DO        ondansetron (ZOFRAN) injection 4 mg  4 mg IntraVENous Q30 Min PRN Deleon Passe, DO        morphine sulfate (PF) injection 4 mg  4 mg IntraVENous Q30 Min PRN Anastacio Bua, DO        sodium chloride 0.9 % bolus 1,000 mL  1,000 mL IntraVENous Once Fayne Mediate, DO         Current Outpatient Medications   Medication Sig Dispense Refill    ofloxacin (OCUFLOX) 0.3 % solution       cephALEXin (KEFLEX) 500 MG capsule Take 1 capsule by mouth 4 times daily for 7 days 28 capsule 0    senna (SENOKOT) 8.6 MG TABS tablet Take 1 tablet by mouth daily 120 tablet 0    albuterol sulfate HFA (VENTOLIN HFA) 108 (90 Base) MCG/ACT inhaler Inhale 2 puffs into the lungs every 6 hours as needed for Wheezing      apixaban (ELIQUIS) 5 MG TABS tablet Take 1 tablet by mouth 2 times daily      atropine 1 % ophthalmic solution Place 1 drop into both eyes in the morning and at bedtime      hydrOXYzine HCl (ATARAX) 50 MG tablet Take 1 tablet by mouth every 8 hours as needed for Anxiety      lidocaine (HM LIDOCAINE PATCH) 4 % external patch Place 1 patch onto the skin daily      methadone (DOLOPHINE) 10 MG/ML solution Take 6 mLs by mouth every 4 hours as needed for Pain. Max Daily Amount: 360 mg      prazosin (MINIPRESS) 2 MG capsule Take 1 capsule by mouth nightly      prednisoLONE acetate (PRED FORTE) 1 % ophthalmic suspension Place 1 drop into both eyes 4 times daily 1 drop left eye 4 times a day, 1 drop right eye 1 time a day.      propranolol (INDERAL) 20 MG tablet Take 1 tablet by mouth 2 times daily as needed (anxiety)      tiotropium (SPIRIVA) 18 MCG inhalation capsule Inhale 1 capsule into the lungs daily      triamcinolone (KENALOG) 0.1 % cream Apply topically 2 times daily Apply to wound one time a day      venlafaxine (EFFEXOR XR) 75 MG extended release capsule Take 1 capsule by mouth daily         Nursing Notes  Reviewed    VITAL SIGNS:  ED Triage Vitals   Enc Vitals Group      BP       Pulse       Resp       Temp       Temp src       SpO2       Weight       Height       Head Circumference       Peak Flow       Pain Score  Pain Loc       Pain Edu?       Excl. in GC?        PHYSICAL EXAM:  Physical Exam  Vitals and nursing note reviewed.   Constitutional:       General: She is not in acute distress.     Appearance: Normal appearance. She is not ill-appearing, toxic-appearing or diaphoretic.   HENT:      Head: Normocephalic and atraumatic.      Right Ear: External ear normal.      Left Ear: External ear normal.   Eyes:      General: No scleral icterus.        Right eye: No discharge.         Left eye: No discharge.      Extraocular Movements: Extraocular movements intact.      Conjunctiva/sclera: Conjunctivae normal.      Pupils: Pupils are equal, round, and reactive to light.   Cardiovascular:      Rate and Rhythm: Normal rate and regular rhythm.      Pulses: Normal pulses.      Heart sounds: Normal heart sounds. No murmur heard.     No friction rub. No gallop.   Pulmonary:      Effort: Pulmonary effort is normal. No respiratory distress.      Breath sounds: Normal breath sounds. No stridor. No wheezing, rhonchi or rales.   Chest:      Chest wall: Tenderness present.       Abdominal:      General: Bowel sounds are normal. There is no distension. There are no signs of injury.      Palpations: Abdomen is soft. There is no mass or pulsatile mass.      Tenderness: There is abdominal tenderness in the epigastric area, left upper quadrant and left lower quadrant. There is left CVA tenderness. There is no guarding or rebound. Negative signs include Murphy's sign, Rovsing's sign and McBurney's sign.      Hernia: No hernia is present.       Musculoskeletal:         General: Tenderness present. No swelling, deformity or signs of injury. Normal range of motion.      Cervical back: Normal range of motion. No rigidity.      Right  lower leg: No edema.      Left lower leg: No edema.   Skin:     General: Skin is warm.      Coloration: Skin is not jaundiced or pale.      Findings: No bruising, erythema, lesion or rash.   Neurological:      General: No focal deficit present.      Mental Status: She is alert and oriented to person, place, and time.      GCS: GCS eye subscore is 4. GCS verbal subscore is 5. GCS motor subscore is 6.      Cranial Nerves: Cranial nerves 2-12 are intact. No cranial nerve deficit, dysarthria or facial asymmetry.      Sensory: Sensation is intact. No sensory deficit.      Motor: Motor function is intact. No weakness, tremor, atrophy, abnormal muscle tone or seizure activity.      Coordination: Coordination normal.   Psychiatric:         Mood and Affect: Mood normal.         Behavior: Behavior normal.         Thought Content: Thought content  normal.         Judgment: Judgment normal.           I have reviewed andinterpreted all of the currently available lab results from this visit (if applicable):    Results for orders placed or performed during the hospital encounter of 02/08/23   EKG 12 Lead   Result Value Ref Range    Ventricular Rate 60 BPM    Atrial Rate 60 BPM    P-R Interval 118 ms    QRS Duration 80 ms    Q-T Interval 460 ms    QTc Calculation (Bazett) 460 ms    P Axis 68 degrees    R Axis 69 degrees    T Axis 61 degrees    Diagnosis       Normal sinus rhythm  Possible Left atrial enlargement  T wave abnormality, consider anterolateral ischemia  Prolonged QT  Abnormal ECG  When compared with ECG of 04-Jan-2023 18:12,  Nonspecific T wave abnormality now evident in Inferior leads  T wave inversion now evident in Anterolateral leads          Radiographs (if obtained):  []  The following radiograph was interpreted by myself in the absence of a radiologist:  [x]  Radiologist's Report Reviewed:    CXR, CT Abd/pelv    CT TIBIA FIBULA RIGHT W CONTRAST    Result Date: 02/04/2023  EXAM: CT TIBIA FIBULA RIGHT W CONTRAST  INDICATION:  nonhealing wound - now with swelling/severe pain COMPARISON: None TECHNIQUE: Axial CT imaging obtained through the right tibia/fibula. Axial images and multiplanar reformatted images were reviewed.   Up-to-date CT equipment and radiation dose reduction techniques were employed. IV Contrast: None.   FINDINGS: BONES: No significant abnormality. No acute fracture or osteolysis JOINTS: No dislocation. Mild degenerative changes SOFT TISSUE: Soft tissue edema. No discrete abscess. No soft tissue air. No radiopaque foreign body. Peroneus muscle atrophy. OTHER: Negative.     No acute fracture or osteolysis. Soft tissue edema. No discrete abscess. Electronically signed by Gwenlyn Saran, DO      EKG (if obtained): (All EKG's are interpreted by myself in the absence of a cardiologist)    12 lead EKG per my interpretation:  Normal Sinus Rhythm 60  Axis is   Normal  QTc is   460  There is specific T wave changes appreciated.  Inverted T wave V2 through V5, V6  There is no specific ST wave changes appreciated.    Prior EKG to compare with was available and different than previous      MDM:      Patient complaint of left sided abdominal pain nausea vomiting left-sided rib pain.  Symptoms started this morning.  Constant all day long.  Has not had medication for.  History of hepatitis.  Denies any smoking drinking drugs.  She is on Eliquis for recurrent DVT in her leg.  Denies any other questions or concerns she has been having streaks of blood in her vomit.  On arrival she is in no distress, does have pain to her left ribs, abdomen there is no obvious pulsatile mass or hernia breath sounds are clear she is in no distress I did do labs imaging EKG chest x-ray CT abdomen pelvis.  We given pain nausea medicine fluids.  EKG does have new inverted T waves in V2 through V6 I did talk to cardiology this is not a Wellen syndrome not a STEMI but does need workup.      CLINICAL  IMPRESSION:  Final diagnoses:   Abdominal pain,  unspecified abdominal location   Nausea and vomiting, unspecified vomiting type   Chest pain, unspecified type   Acute electrocardiogram changes   History of hepatitis C       (Please note that portions of this note may have been completed with a voice recognition program. Efforts were made to edit the dictations but occasionally words aremis-transcribed.)    DISPOSITION REFERRAL (if applicable):  No follow-up provider specified.    DISPOSITION MEDICATIONS (if applicable):  New Prescriptions    No medications on file          Fayne Mediate, DO
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
## 2023-02-08 NOTE — ED Notes (Signed)
RN covered patient with a warm blanket.

## 2023-02-08 NOTE — ED Notes (Signed)
Discharge instructions and prescriptions reviewed with pt and verbalizes understanding.

## 2023-02-09 MED ORDER — DIPHENHYDRAMINE HCL 50 MG/ML IJ SOLN
50 | Freq: Once | INTRAMUSCULAR | Status: AC
Start: 2023-02-09 — End: 2023-02-08
  Administered 2023-02-09: 50 mg via INTRAVENOUS

## 2023-02-09 MED ORDER — SENNA-DOCUSATE SODIUM 8.6-50 MG PO TABS
Freq: Every day | ORAL | Status: DC | PRN
Start: 2023-02-09 — End: 2023-02-08
  Administered 2023-02-09: 01:00:00 2 via ORAL

## 2023-02-09 MED ORDER — MORPHINE SULFATE (PF) 4 MG/ML IJ SOLN
4 | Freq: Once | INTRAMUSCULAR | Status: AC
Start: 2023-02-09 — End: 2023-02-08
  Administered 2023-02-09: 4 mg via INTRAVENOUS

## 2023-02-09 MED ORDER — LEVOFLOXACIN 750 MG PO TABS
750 | ORAL_TABLET | Freq: Every day | ORAL | 0 refills | Status: AC
Start: 2023-02-09 — End: 2023-02-15

## 2023-02-09 MED ORDER — METOCLOPRAMIDE HCL 5 MG/ML IJ SOLN
5 | Freq: Once | INTRAMUSCULAR | Status: AC
Start: 2023-02-09 — End: 2023-02-08
  Administered 2023-02-09: 10 mg via INTRAVENOUS

## 2023-02-09 MED ORDER — POLYETHYLENE GLYCOL 3350 17 GM/SCOOP PO POWD
17 | Freq: Every day | ORAL | 0 refills | Status: AC
Start: 2023-02-09 — End: 2023-03-10

## 2023-02-09 MED FILL — MORPHINE SULFATE 4 MG/ML IJ SOLN: 4 mg/mL | INTRAMUSCULAR | Qty: 1

## 2023-02-09 MED FILL — DIPHENHYDRAMINE HCL 50 MG/ML IJ SOLN: 50 MG/ML | INTRAMUSCULAR | Qty: 1

## 2023-02-09 MED FILL — METOCLOPRAMIDE HCL 5 MG/ML IJ SOLN: 5 MG/ML | INTRAMUSCULAR | Qty: 2

## 2023-02-09 MED FILL — SENNA-S 8.6-50 MG PO TABS: ORAL | Qty: 2

## 2023-02-10 ENCOUNTER — Encounter: Payer: PRIVATE HEALTH INSURANCE | Attending: Primary Care

## 2023-02-10 LAB — CULTURE, URINE: Culture: NO GROWTH

## 2023-02-10 LAB — EKG 12-LEAD
Atrial Rate: 60 {beats}/min
Diagnosis: NORMAL
P Axis: 68 degrees
P-R Interval: 118 ms
Q-T Interval: 460 ms
QRS Duration: 80 ms
QTc Calculation (Bazett): 460 ms
R Axis: 69 degrees
T Axis: 61 degrees
Ventricular Rate: 60 {beats}/min

## 2023-02-19 ENCOUNTER — Inpatient Hospital Stay
Admit: 2023-02-19 | Discharge: 2023-02-19 | Disposition: A | Discharge status: Home / Self Care | Payer: MEDICARE | Attending: Emergency Medicine

## 2023-02-19 DIAGNOSIS — F1123 Opioid dependence with withdrawal: Principal | ICD-10-CM

## 2023-02-19 DIAGNOSIS — F1193 Opioid use, unspecified with withdrawal: Secondary | ICD-10-CM

## 2023-02-19 MED ORDER — METHADONE HCL 10 MG PO TABS
10 | Freq: Once | ORAL | Status: AC
Start: 2023-02-19 — End: 2023-02-19
  Administered 2023-02-19: 22:00:00 100 mg via ORAL

## 2023-02-19 MED ORDER — ONDANSETRON 4 MG PO TBDP
4 MG | ORAL_TABLET | Freq: Three times a day (TID) | ORAL | 0 refills | Status: AC | PRN
Start: 2023-02-19 — End: ?

## 2023-02-19 MED ORDER — ONDANSETRON 4 MG PO TBDP
4 | Freq: Once | ORAL | Status: AC
Start: 2023-02-19 — End: 2023-02-19
  Administered 2023-02-19: 22:00:00 4 mg via ORAL

## 2023-02-19 MED ORDER — ONDANSETRON 4 MG PO TBDP
4 | ORAL_TABLET | Freq: Three times a day (TID) | ORAL | 0 refills | Status: AC | PRN
Start: 2023-02-19 — End: ?

## 2023-02-19 MED FILL — ONDANSETRON 4 MG PO TBDP: 4 MG | ORAL | Qty: 1

## 2023-02-19 MED FILL — METHADONE HCL 10 MG PO TABS: 10 MG | ORAL | Qty: 10

## 2023-02-19 NOTE — ED Notes (Signed)
Discharge instructions and prescription reviewed with pt and verbalizes understanding.

## 2023-02-19 NOTE — ED Provider Notes (Addendum)
[facility]      TRIAGE CHIEF COMPLAINT:   No chief complaint on file.      HOPI:  Shari Henderson is a 52 y.o. female that presents with complaint of opiate withdrawal.  Patient just got back from [place] she is here with her father who is also being seen for something unrelated.  She is on methadone, she states she ran out yesterday she takes 110 mg a day she has her bottles with her they are all empty.  She denies any misuse but somewhat like they expire on 20 June some expired on 6 June she denies abusing them or anybody else using them.  She states she is going through withdrawals nausea diaphoresis palpitations no abdominal pain no chest pain shortness of breath just asking for her 1 dose of methadone for today.  No other questions or concerns they were out of town just got back.Marland Kitchen    REVIEW OF SYSTEMS:  At least 10 systems reviewed and otherwise acutely negative except as in the HOPI.    Review of Systems   Constitutional:  Positive for diaphoresis.   HENT: Negative.     Eyes: Negative.    Respiratory: Negative.     Cardiovascular:  Positive for palpitations.   Gastrointestinal:  Positive for nausea.   Endocrine: Negative.    Genitourinary: Negative.    Musculoskeletal: Negative.    Skin: Negative.    Allergic/Immunologic: Negative.    Neurological: Negative.    Hematological: Negative.    Psychiatric/Behavioral: Negative.     All other systems reviewed and are negative.      Past Medical History:   Diagnosis Date    Bipolar 1 disorder (HCC)     Diabetes mellitus (HCC)     DVT (deep venous thrombosis) (HCC)     Schizo affective schizophrenia (HCC)      Past Surgical History:   Procedure Laterality Date    VEIN SURGERY Right     vein and artery reconstruction in right leg     No family history on file.  Social History     Socioeconomic History    Marital status: Single     Spouse name: Not on file    Number of children: Not on file    Years of education: Not on file    Highest  education level: Not on file   Occupational History    Not on file   Tobacco Use    Smoking status: Every Day     Current packs/day: 0.50     Types: Cigarettes    Smokeless tobacco: Never   Vaping Use    Vaping Use: Never used   Substance and Sexual Activity    Alcohol use: Not Currently    Drug use: Not Currently     Types: Opiates     Sexual activity: Not on file   Other Topics Concern    Not on file   Social History Narrative    Not on file     Social Determinants of Health     Financial Resource Strain: Not on file   Food Insecurity: Not on file   Transportation Needs: Not on file   Physical Activity: Not on file   Stress: Not on file   Social Connections: Not on file   Intimate Partner Violence: Not on file   Housing Stability: Not on file     No current facility-administered medications for this encounter.  Current Outpatient Medications   Medication Sig Dispense Refill    ondansetron (ZOFRAN-ODT) 4 MG disintegrating tablet Take 1 tablet by mouth 3 times daily as needed for Nausea or Vomiting 21 tablet 0    ondansetron (ZOFRAN-ODT) 4 MG disintegrating tablet Take 1 tablet by mouth 3 times daily as needed for Nausea or Vomiting 21 tablet 0    polyethylene glycol (GLYCOLAX) 17 GM/SCOOP powder Take 17 g by mouth daily 510 g 0    ofloxacin (OCUFLOX) 0.3 % solution       senna (SENOKOT) 8.6 MG TABS tablet Take 1 tablet by mouth daily 120 tablet 0    albuterol sulfate HFA (VENTOLIN HFA) 108 (90 Base) MCG/ACT inhaler Inhale 2 puffs into the lungs every 6 hours as needed for Wheezing      apixaban (ELIQUIS) 5 MG TABS tablet Take 1 tablet by mouth 2 times daily      atropine 1 % ophthalmic solution Place 1 drop into both eyes in the morning and at bedtime      hydrOXYzine HCl (ATARAX) 50 MG tablet Take 1 tablet by mouth every 8 hours as needed for Anxiety      lidocaine (HM LIDOCAINE PATCH) 4 % external patch Place 1 patch onto the skin daily      methadone (DOLOPHINE) 10 MG/ML solution Take 6 mLs by mouth every 4  hours as needed for Pain. Max Daily Amount: 360 mg      prazosin (MINIPRESS) 2 MG capsule Take 1 capsule by mouth nightly      prednisoLONE acetate (PRED FORTE) 1 % ophthalmic suspension Place 1 drop into both eyes 4 times daily 1 drop left eye 4 times a day, 1 drop right eye 1 time a day.      propranolol (INDERAL) 20 MG tablet Take 1 tablet by mouth 2 times daily as needed (anxiety)      tiotropium (SPIRIVA) 18 MCG inhalation capsule Inhale 1 capsule into the lungs daily      triamcinolone (KENALOG) 0.1 % cream Apply topically 2 times daily Apply to wound one time a day      venlafaxine (EFFEXOR XR) 75 MG extended release capsule Take 1 capsule by mouth daily        Allergies   Allergen Reactions    Seroquel [Quetiapine] Other (See Comments)     Suicidal thoughts    Trazodone Other (See Comments)     Hallucinations    Ibuprofen Hives    Norco [Hydrocodone-Acetaminophen] Rash    Other Rash     TYLOX    Tylenol [Acetaminophen]      Liver failure    Wellbutrin [Bupropion] Other (See Comments)     Suicidal thoughts    Lyrica [Pregabalin] Nausea And Vomiting     No current facility-administered medications for this encounter.     Current Outpatient Medications   Medication Sig Dispense Refill    ondansetron (ZOFRAN-ODT) 4 MG disintegrating tablet Take 1 tablet by mouth 3 times daily as needed for Nausea or Vomiting 21 tablet 0    ondansetron (ZOFRAN-ODT) 4 MG disintegrating tablet Take 1 tablet by mouth 3 times daily as needed for Nausea or Vomiting 21 tablet 0    polyethylene glycol (GLYCOLAX) 17 GM/SCOOP powder Take 17 g by mouth daily 510 g 0    ofloxacin (OCUFLOX) 0.3 % solution       senna (SENOKOT) 8.6 MG TABS tablet Take 1 tablet by mouth daily 120 tablet 0    albuterol  sulfate HFA (VENTOLIN HFA) 108 (90 Base) MCG/ACT inhaler Inhale 2 puffs into the lungs every 6 hours as needed for Wheezing      apixaban (ELIQUIS) 5 MG TABS tablet Take 1 tablet by mouth 2 times daily      atropine 1 % ophthalmic solution Place  1 drop into both eyes in the morning and at bedtime      hydrOXYzine HCl (ATARAX) 50 MG tablet Take 1 tablet by mouth every 8 hours as needed for Anxiety      lidocaine (HM LIDOCAINE PATCH) 4 % external patch Place 1 patch onto the skin daily      methadone (DOLOPHINE) 10 MG/ML solution Take 6 mLs by mouth every 4 hours as needed for Pain. Max Daily Amount: 360 mg      prazosin (MINIPRESS) 2 MG capsule Take 1 capsule by mouth nightly      prednisoLONE acetate (PRED FORTE) 1 % ophthalmic suspension Place 1 drop into both eyes 4 times daily 1 drop left eye 4 times a day, 1 drop right eye 1 time a day.      propranolol (INDERAL) 20 MG tablet Take 1 tablet by mouth 2 times daily as needed (anxiety)      tiotropium (SPIRIVA) 18 MCG inhalation capsule Inhale 1 capsule into the lungs daily      triamcinolone (KENALOG) 0.1 % cream Apply topically 2 times daily Apply to wound one time a day      venlafaxine (EFFEXOR XR) 75 MG extended release capsule Take 1 capsule by mouth daily         Nursing Notes Reviewed    VITAL SIGNS:  ED Triage Vitals   Enc Vitals Group      BP       Pulse       Resp       Temp       Temp src       SpO2       Weight       Height       Head Circumference       Peak Flow       Pain Score       Pain Loc       Pain Edu?       Excl. in GC?        PHYSICAL EXAM:  Physical Exam  Vitals and nursing note reviewed.   Constitutional:       General: She is not in acute distress.     Appearance: Normal appearance. She is well-developed and well-groomed. She is diaphoretic. She is not ill-appearing or toxic-appearing.   HENT:      Head: Normocephalic and atraumatic.      Right Ear: External ear normal.      Left Ear: External ear normal.   Eyes:      General: No scleral icterus.        Right eye: No discharge.         Left eye: No discharge.      Extraocular Movements: Extraocular movements intact.      Conjunctiva/sclera: Conjunctivae normal.   Cardiovascular:      Rate and Rhythm: Regular rhythm. Tachycardia  present.      Pulses: Normal pulses.      Heart sounds: Normal heart sounds.   Pulmonary:      Effort: Pulmonary effort is normal. No respiratory distress.      Breath sounds: Normal breath sounds. No stridor.  No wheezing, rhonchi or rales.   Abdominal:      General: Bowel sounds are normal. There is no distension.      Palpations: Abdomen is soft. There is no mass.      Tenderness: There is no abdominal tenderness. There is no guarding or rebound. Negative signs include Murphy's sign, Rovsing's sign and McBurney's sign.      Hernia: No hernia is present.   Musculoskeletal:         General: No swelling, tenderness, deformity or signs of injury. Normal range of motion.      Cervical back: Normal range of motion. No rigidity.      Right lower leg: No edema.      Left lower leg: No edema.   Skin:     General: Skin is warm.      Coloration: Skin is not jaundiced or pale.      Findings: No bruising, erythema, lesion or rash.   Neurological:      General: No focal deficit present.      Mental Status: She is alert and oriented to person, place, and time.      GCS: GCS eye subscore is 4. GCS verbal subscore is 5. GCS motor subscore is 6.      Cranial Nerves: Cranial nerves 2-12 are intact. No cranial nerve deficit, dysarthria or facial asymmetry.      Sensory: Sensation is intact. No sensory deficit.      Motor: Motor function is intact. No weakness, tremor, atrophy, abnormal muscle tone or seizure activity.      Coordination: Coordination normal.   Psychiatric:         Mood and Affect: Mood normal.         Behavior: Behavior normal. Behavior is cooperative.         Thought Content: Thought content normal.         Judgment: Judgment normal.           I have reviewed andinterpreted all of the currently available lab results from this visit (if applicable):    No results found for this visit on 02/19/23.     Radiographs (if obtained):  []  The following radiograph was interpreted by myself in the absence of a radiologist:  []   Radiologist's Report Reviewed:    CT ABDOMEN PELVIS W IV CONTRAST Additional Contrast? None    Result Date: 02/08/2023  Exam: CT ABDOMEN PELVIS W IV CONTRAST INDICATION: abd pain L sided/n/v/hematemesis? COMPARISON: 01/01/2023 TECHNIQUE: Helically-acquired axial images were obtained through the abdomen and pelvis. Multiplanar reformats were reconstructed. Up-to-date CT equipment and radiation dose reduction techniques were employed. IV Contrast: 100 cc Isovue 370 Oral Contrast: None. FINDINGS: LOWER CHEST: Consolidative opacities of the right middle lobe and base of the left upper lobe are not significantly changed from comparison. LIVER: Normal morphology. No suspicious hepatic lesion. GALLBLADDER AND BILIARY TREE: Gallbladder not distended.  No intrahepatic or extrahepatic biliary dilatation. PANCREAS: No evidence of mass or inflammation. SPLEEN: Unremarkable. ADRENAL GLANDS: Adrenal glands are normal. KIDNEYS AND URETERS: No suspicious renal lesion. No renal calculi or hydronephrosis. GASTROINTESTINAL: No evidence of abnormal bowel wall thickening or obstruction. Moderate stool burden throughout the colon. Appendix is not definitively identified. VASCULATURE: Aorta is normal in caliber. Major intra-abdominal vessels are patent. LYMPH NODES: No pathologically enlarged lymph nodes. PERITONEUM/RETROPERITONEUM: No free air or ascites. PELVIC ORGANS AND BLADDER: Fibroid uterus. BODY WALL AND SOFT TISSUES: Right perianal soft tissue density measuring approximately 2.3 x 1.9 cm. BONES:  No acute or suspicious abnormality.     1.  Findings which can be seen in constipation in the appropriate clinical setting. 2.  Possible right borderline gland cyst. Recommend correlation with direct visualization. 3.  Stable consolidative opacities of the right middle lobe and base of the left upper lobe. Electronically signed by Mina Marble    XR CHEST PORTABLE    Result Date: 02/08/2023  PORTABLE AP CHEST AT 1747 HOURS:   HISTORY:  Left-sided chest pain. COMPARISON: None.     FINDINGS: The heart and pulmonary vasculature are within normal limits. Lungs appear well-expanded bilaterally. There is some mild ill-defined increased density in the right base. Lungs are otherwise clear. There are no pleural effusions. Bony structures appear intact.       Mild increased density in the right base, consistent with early infiltrate or atelectasis. No other acute cardiopulmonary findings. Electronically signed by Marshall Cork, MD    CT TIBIA FIBULA RIGHT W CONTRAST    Result Date: 02/04/2023  EXAM: CT TIBIA FIBULA RIGHT W CONTRAST INDICATION:  nonhealing wound - now with swelling/severe pain COMPARISON: None TECHNIQUE: Axial CT imaging obtained through the right tibia/fibula. Axial images and multiplanar reformatted images were reviewed.   Up-to-date CT equipment and radiation dose reduction techniques were employed. IV Contrast: None.   FINDINGS: BONES: No significant abnormality. No acute fracture or osteolysis JOINTS: No dislocation. Mild degenerative changes SOFT TISSUE: Soft tissue edema. No discrete abscess. No soft tissue air. No radiopaque foreign body. Peroneus muscle atrophy. OTHER: Negative.     No acute fracture or osteolysis. Soft tissue edema. No discrete abscess. Electronically signed by Gwenlyn Saran, DO      EKG (if obtained): (All EKG's are interpreted by myself in the absence of a cardiologist)    MDM:    Patient here with complaint of opiate withdrawal syndrome.  Again patient is on methadone, she just got back from [place], her father is also being seen here for something unrelated.  They both got back from [place] today.  She has run out of her methadone yesterday.  She is going through withdrawal symptoms nausea diaphoresis palpitations she has no fevers chest pain shortness of breath abdominal pain she has her bottles with her 110 mg in the bottle and she takes 1 daily, she states she ran out yesterday.  Her pill bottles are  with her other empty but similar expired on 6 June some expired on 29 June.  She denies any misuse or anybody else using them.  I did talk to pharmacy I do not have a solution here I did give her 100 mg pill, tablet I do not think she needs further labs imaging I gave her Zofran.  She does appear to be going through withdrawal but she is in no distress she denies other question concerns she is asking for her 1 dose of medication here she has been here before for this as well at this time patient stable to be given medication discharged home given return precautions and follow-up information.    CLINICAL IMPRESSION:  Final diagnoses:   Opioid withdrawal (HCC)       (Please note that portions of this note may have been completed with a voice recognition program. Efforts were made to edit the dictations but occasionally words aremis-transcribed.)    DISPOSITION REFERRAL (if applicable):  [facility] Emergency Department  [address]  [place] [address]  [phone]        Ruthine Dose,  MD  [address]  [address]  [place] [address]  [phone]    Schedule an appointment as soon as possible for a visit         DISPOSITION MEDICATIONS (if applicable):  New Prescriptions    ONDANSETRON (ZOFRAN-ODT) 4 MG DISINTEGRATING TABLET    Take 1 tablet by mouth 3 times daily as needed for Nausea or Vomiting    ONDANSETRON (ZOFRAN-ODT) 4 MG DISINTEGRATING TABLET    Take 1 tablet by mouth 3 times daily as needed for Nausea or Vomiting          Fayne Mediate, DO           Fayne Mediate, DO  02/19/23 1753       Fayne Mediate, DO  02/19/23 1810
